# Patient Record
Sex: Female | Born: 1975 | Race: White | Hispanic: Yes | Marital: Single | Smoking: Never smoker
Health system: Southern US, Community
[De-identification: ages and names within clinical notes are randomized; demographics above are authoritative.]

---

## 1998-03-10 ENCOUNTER — Other Ambulatory Visit: Admission: RE | Admit: 1998-03-10 | Discharge: 1998-03-10 | Payer: Self-pay | Admitting: Obstetrics and Gynecology

## 1998-04-03 ENCOUNTER — Inpatient Hospital Stay (HOSPITAL_COMMUNITY): Admission: AD | Admit: 1998-04-03 | Discharge: 1998-04-03 | Payer: Self-pay | Admitting: Obstetrics

## 1998-10-05 ENCOUNTER — Inpatient Hospital Stay (HOSPITAL_COMMUNITY): Admission: AD | Admit: 1998-10-05 | Discharge: 1998-10-05 | Payer: Self-pay | Admitting: Obstetrics & Gynecology

## 1998-10-05 ENCOUNTER — Inpatient Hospital Stay (HOSPITAL_COMMUNITY): Admission: AD | Admit: 1998-10-05 | Discharge: 1998-10-08 | Payer: Self-pay | Admitting: *Deleted

## 1998-10-09 ENCOUNTER — Encounter (HOSPITAL_COMMUNITY): Admission: RE | Admit: 1998-10-09 | Discharge: 1999-01-07 | Payer: Self-pay | Admitting: Obstetrics & Gynecology

## 2000-04-24 ENCOUNTER — Encounter: Payer: Self-pay | Admitting: Obstetrics & Gynecology

## 2000-04-24 ENCOUNTER — Ambulatory Visit (HOSPITAL_COMMUNITY): Admission: RE | Admit: 2000-04-24 | Discharge: 2000-04-24 | Payer: Self-pay | Admitting: Obstetrics & Gynecology

## 2000-06-02 ENCOUNTER — Encounter: Payer: Self-pay | Admitting: *Deleted

## 2000-06-02 ENCOUNTER — Ambulatory Visit (HOSPITAL_COMMUNITY): Admission: RE | Admit: 2000-06-02 | Discharge: 2000-06-02 | Payer: Self-pay | Admitting: *Deleted

## 2000-09-29 ENCOUNTER — Inpatient Hospital Stay (HOSPITAL_COMMUNITY): Admission: AD | Admit: 2000-09-29 | Discharge: 2000-10-01 | Payer: Self-pay | Admitting: Obstetrics & Gynecology

## 2004-08-26 ENCOUNTER — Inpatient Hospital Stay (HOSPITAL_COMMUNITY): Admission: AD | Admit: 2004-08-26 | Discharge: 2004-08-28 | Payer: Self-pay | Admitting: Obstetrics

## 2006-06-28 ENCOUNTER — Encounter: Admission: RE | Admit: 2006-06-28 | Discharge: 2006-06-28 | Payer: Self-pay | Admitting: Family Medicine

## 2011-04-28 ENCOUNTER — Other Ambulatory Visit (HOSPITAL_COMMUNITY)
Admission: RE | Admit: 2011-04-28 | Discharge: 2011-04-28 | Disposition: A | Discharge status: Home / Self Care | Payer: Self-pay | Source: Ambulatory Visit | Attending: Gynecology | Admitting: Gynecology

## 2011-04-28 ENCOUNTER — Ambulatory Visit (INDEPENDENT_AMBULATORY_CARE_PROVIDER_SITE_OTHER): Payer: Self-pay | Admitting: Gynecology

## 2011-04-28 ENCOUNTER — Ambulatory Visit: Payer: Self-pay | Admitting: Gynecology

## 2011-04-28 ENCOUNTER — Encounter: Payer: Self-pay | Admitting: Gynecology

## 2011-04-28 DIAGNOSIS — Z113 Encounter for screening for infections with a predominantly sexual mode of transmission: Secondary | ICD-10-CM

## 2011-04-28 DIAGNOSIS — N898 Other specified noninflammatory disorders of vagina: Secondary | ICD-10-CM

## 2011-04-28 DIAGNOSIS — R300 Dysuria: Secondary | ICD-10-CM

## 2011-04-28 DIAGNOSIS — Z01419 Encounter for gynecological examination (general) (routine) without abnormal findings: Secondary | ICD-10-CM | POA: Insufficient documentation

## 2011-04-28 LAB — CBC WITH DIFFERENTIAL/PLATELET
Basophils Absolute: 0.1 10*3/uL (ref 0.0–0.1)
Basophils Relative: 2 % — ABNORMAL HIGH (ref 0–1)
Eosinophils Absolute: 0.1 10*3/uL (ref 0.0–0.7)
Eosinophils Relative: 2 % (ref 0–5)
HCT: 37.8 % (ref 36.0–46.0)
MCHC: 32.8 g/dL (ref 30.0–36.0)
MCV: 87.9 fL (ref 78.0–100.0)
Monocytes Absolute: 0.5 10*3/uL (ref 0.1–1.0)
Platelets: 239 10*3/uL (ref 150–400)
RDW: 13.8 % (ref 11.5–15.5)
WBC: 4.5 10*3/uL (ref 4.0–10.5)

## 2011-04-28 LAB — URINALYSIS W MICROSCOPIC + REFLEX CULTURE
Bilirubin Urine: NEGATIVE
Casts: NONE SEEN
Crystals: NONE SEEN
Glucose, UA: NEGATIVE mg/dL
Ketones, ur: NEGATIVE mg/dL
Leukocytes, UA: NEGATIVE
Nitrite: NEGATIVE
Protein, ur: NEGATIVE mg/dL
Specific Gravity, Urine: 1.015 (ref 1.005–1.030)
Urobilinogen, UA: 0.2 mg/dL (ref 0.0–1.0)
WBC, UA: NONE SEEN WBC/hpf (ref <3)
pH: 5.5 (ref 5.0–8.0)

## 2011-04-28 LAB — WET PREP FOR TRICH, YEAST, CLUE
Clue Cells Wet Prep HPF POC: NONE SEEN
Trich, Wet Prep: NONE SEEN
Yeast Wet Prep HPF POC: NONE SEEN

## 2011-04-28 MED ORDER — CLINDAMYCIN PHOSPHATE 2 % VA CREA: 1.0000 | TOPICAL_CREAM | Freq: Every day | VAGINAL | Status: AC

## 2011-04-28 NOTE — Progress Notes (Signed)
History:    36 y.o.  for annual exam who is a gravida 3 para 3 and had her annual exam done at the health department over 2-1/2 years ago and states it was normal. Patient complaining of one-month history of dysuria frequency and now most recently vulvar pruritus and a yellow discharge. Patient frequently does her self breast examination. Had a ParaGard T380A IUD placed in the health department and she states is due to be removed in 2016. She states her menstrual cycles are regular.  Past medical history,surgical history, family history and social history were all reviewed and documented in the EPIC chart.  Gynecologic History Patient's last menstrual period was 04/16/2011. Contraception: IUD Last Pap: 2010. Results were: normal Last mammogram: Not indicated. Results were: Not indicated  Obstetric History OB History    Grav Para Term Preterm Abortions TAB SAB Ect Mult Living   3 3 3       3      # Outc Date GA Lbr Len/2nd Wgt Sex Del Anes PTL Lv   1 TRM     F SVD  No Yes   2 TRM     M SVD  No Yes   3 TRM     F SVD  No Yes       ROS:  Was performed and pertinent positives and negatives are included in the history.  Exam: chaperone present  BP 118/78  Ht 5' 0.75" (1.543 m)  Wt 142 lb (64.411 kg)  BMI 27.05 kg/m2  LMP 04/16/2011  Body mass index is 27.05 kg/(m^2).  General appearance : Well developed well nourished female. No acute distress HEENT: Neck supple, trachea midline, no carotid bruits, no thyroidmegaly Lungs: Clear to auscultation, no rhonchi or wheezes, or rib retractions  Heart: Regular rate and rhythm, no murmurs or gallops Breast:Examined in sitting and supine position were symmetrical in appearance, no palpable masses or tenderness,  no skin retraction, no nipple inversion, no nipple discharge, no skin discoloration, no axillary or supraclavicular lymphadenopathy Abdomen: no palpable masses or tenderness, no rebound or guarding Extremities: no edema or skin  discoloration or tenderness  Pelvic:  Bartholin, Urethra, Skene Glands: Within normal limits             Vagina: No gross lesions or discharge  Cervix: No gross lesions or discharge, IUD string seen  Uterus  retroverted, normal size, shape and consistency, non-tender and mobile  Adnexa  Without masses or tenderness  Anus and perineum  normal   Rectovaginal  normal sphincter tone without palpated masses or tenderness             Hemoccult not done   Wet prep results: Few bacteria  Urinalysis result: Nonspecific was sent for culture  Assessment/Plan:  36 y.o. female for annual exam will treat patient for suspected BV with Cleocin vaginal cream to apply each bedtime for 5 days. New Pap smear guidelines discussed. Last Pap smear returned a half years ago so Pap smear was done today. She is instructed to her monthly self breast examination.CBC, cholesterol, and urinalysis was obtained today. CBC, cholesterol, and urinalysis and urine culture were obtained today. GC and Chlamydia culture obtained as well resulting in time of this dictation. Will notify patient is any abnormality of any the above mentioned test otherwise we'll see her back in one year or when necessary. She scheduled to have her IUD removed in 2016.

## 2011-04-28 NOTE — Patient Instructions (Signed)
Auto examen de mama (Breast Self-Exam) El auto examen puede ayudarla a encontrar modificaciones o trastornos en la mama cuando todava son pequeos. Haga el auto examen de la mama:  Todos los meses.   Una semana despus de su perodo (ciclo menstrual o periodo menstrual).   El primer da de cada mes, si ya no tiene el perodo.  Debe estar atenta a:  Cambios en el color, tamao o forma de la mama.   Hoyuelos en los senos.   Modificaciones en los pezones o la piel.   Sequedad en la piel de los senos o los pezones.   Secreciones acuosas o sanguinolentas en los pezones.  Trate de sentir la presencia de:  Bultos.   Durezas.   Cualquier otro cambio.  CUIDADOS EN EL HOGAR  Hay 3 formas de hacer el examen autoexamen de mama: Prese frente a un espejo.  Levante los brazos por arriba de la cabeza y gire de un lado al otro.   Coloque las manos en las caderas e inclnese hacia abajo y luego gire de un lado al otro.   Inclnese hacia delante y gire de un lado al otro.  En la ducha.  Con las manos enjabonadas, revise los dos pechos. Luego controle por arriba y por debajo de la clavcula y las axilas.   Pase los dedos por la zona superior e inferior de la clavcula hasta debajo del seno, y desde el centro del pecho hasta el borde exterior de la axila. Controle si hay bultos o zonas duras.   Utilizando las yemas de tres dedos del medio revise todo su seno presionando la mano sobre el pecho, haciendo crculos o movimientos hacia arriba y hacia abajo.  Acostada.  Acustese sobre su cama.   Coloque una almohada pequea debajo de la mama que va a controlar. En esa misma posicin, ponga la mano detrs de la cabeza.   Con la otra mano, use los 3 dedos del medio para palpar el pecho.   Mueva los dedos en un crculo alrededor de la mama. Presione firmemente sobre todas las partes de la mama para detectar bultos.  SOLICITE AYUDA DE INMEDIATO SI: Encuentra algn cambio para que puedan  realizarle un estudio. Document Released: 03/12/2010 Document Revised: 01/27/2011 ExitCare Patient Information 2012 [employer]. 

## 2011-04-29 LAB — GC/CHLAMYDIA PROBE AMP, GENITAL
Chlamydia, DNA Probe: NEGATIVE
GC Probe Amp, Genital: NEGATIVE

## 2013-12-23 ENCOUNTER — Encounter: Payer: Self-pay | Admitting: Gynecology

## 2014-10-13 ENCOUNTER — Other Ambulatory Visit: Payer: Self-pay | Admitting: Nurse Practitioner

## 2014-10-13 DIAGNOSIS — T193XXA Foreign body in uterus, initial encounter: Secondary | ICD-10-CM

## 2014-10-15 ENCOUNTER — Ambulatory Visit
Admission: RE | Admit: 2014-10-15 | Discharge: 2014-10-15 | Disposition: A | Discharge status: Home / Self Care | Payer: Self-pay | Source: Ambulatory Visit | Attending: Nurse Practitioner | Admitting: Nurse Practitioner

## 2014-10-15 DIAGNOSIS — T193XXA Foreign body in uterus, initial encounter: Secondary | ICD-10-CM

## 2016-02-19 ENCOUNTER — Ambulatory Visit (INDEPENDENT_AMBULATORY_CARE_PROVIDER_SITE_OTHER): Payer: Self-pay | Admitting: Physician Assistant

## 2016-02-19 VITALS — BP 110/78 | HR 65 | Temp 98.4°F | Resp 16 | Ht 62.0 in | Wt 159.0 lb

## 2016-02-19 DIAGNOSIS — J069 Acute upper respiratory infection, unspecified: Secondary | ICD-10-CM

## 2016-02-19 DIAGNOSIS — B9789 Other viral agents as the cause of diseases classified elsewhere: Secondary | ICD-10-CM

## 2016-02-19 MED ORDER — HYDROCOD POLST-CPM POLST ER 10-8 MG/5ML PO SUER: 5.0000 mL | Freq: Two times a day (BID) | ORAL | 0 refills | Status: DC | PRN

## 2016-02-19 MED ORDER — AZELASTINE HCL 0.15 % NA SOLN: 2.0000 | Freq: Two times a day (BID) | NASAL | 0 refills | Status: DC

## 2016-02-19 MED ORDER — GUAIFENESIN ER 1200 MG PO TB12: 1.0000 | ORAL_TABLET | Freq: Two times a day (BID) | ORAL | 1 refills | Status: DC | PRN

## 2016-02-19 NOTE — Progress Notes (Signed)
Patient ID: Angela Brady, female     DOB: 1975-03-09, 40 y.o.    MRN: [id number]  PCP: No primary care provider on file.  Chief Complaint  Patient presents with  . Cough    With chest pain  . Nasal Congestion  . Watery eyes  . Flu Vaccine    Subjective:   This patient is new to this practice and presents for evaluation of cough and nasal congestion. She is accompanied by her two teenage daughters, the elder of whom translates.  3 days ago she developed initial symptoms, progressively worsening. Advil, NyQuil and Dayquil without benefit. Coughing causes CP and keeps her awake at night. "lungs" hurt. Eyes are itchy and watery. Mild subjective fever/chills. Mild nausea. Mildly tremulous. Has muscle pain in her back and arms. No vomiting or diarrhea.  Has not received flu vaccine this season.   Review of Systems As above.  Prior to Admission medications   Not on File     No Known Allergies   There are no active problems to display for this patient.    History reviewed. No pertinent family history.   Social History   Social History  . Marital status: Single    Spouse name: N/A  . Number of children: 3  . Years of education: 6th grade   Occupational History  .  Not Employed   Social History Main Topics  . Smoking status: Never Smoker  . Smokeless tobacco: Never Used  . Alcohol use No  . Drug use: No  . Sexual activity: Yes    Birth control/ protection: IUD     Comment: PARAGARD   Other Topics Concern  . Not on file   Social History Narrative   [place]   Came to the [place] in [place].   Lives with her husband and 3 children.         Objective:  Physical Exam  Constitutional: She is oriented to person, place, and time. She appears well-developed and well-nourished. She is active and cooperative. No distress.  BP 110/78   Pulse 65   Temp 98.4 F (36.9 C) (Oral)   Resp 16   Ht 5\' 2"  (1.575 m)   Wt 159  lb (72.1 kg)   LMP 01/21/2016   SpO2 99%   BMI 29.08 kg/m   HENT:  Head: Normocephalic and atraumatic.  Right Ear: Hearing and external ear normal.  Left Ear: Hearing and external ear normal.  Nose: Mucosal edema present. No rhinorrhea. Right sinus exhibits maxillary sinus tenderness. Right sinus exhibits no frontal sinus tenderness. Left sinus exhibits maxillary sinus tenderness. Left sinus exhibits no frontal sinus tenderness.  Mouth/Throat: Uvula is midline, oropharynx is clear and moist and mucous membranes are normal. No oropharyngeal exudate.  Eyes: Conjunctivae are normal. No scleral icterus.  Neck: Normal range of motion. Neck supple. No thyromegaly present.  Cardiovascular: Normal rate, regular rhythm and normal heart sounds.   Pulses:      Radial pulses are 2+ on the right side, and 2+ on the left side.  Pulmonary/Chest: Effort normal and breath sounds normal. She has no decreased breath sounds. She has no wheezes. She has no rhonchi. She has no rales.  Lymphadenopathy:       Head (right side): No tonsillar, no preauricular, no posterior auricular and no occipital adenopathy present.       Head (left side): No tonsillar, no preauricular, no posterior auricular and no occipital adenopathy present.  She has no cervical adenopathy.       Right: No supraclavicular adenopathy present.       Left: No supraclavicular adenopathy present.  Neurological: She is alert and oriented to person, place, and time. No sensory deficit.  Skin: Skin is warm, dry and intact. No rash noted. No cyanosis or erythema. Nails show no clubbing.  Psychiatric: She has a normal mood and affect. Her speech is normal and behavior is normal.             Assessment & Plan:  1. Viral URI with cough Supportive care.  Anticipatory guidance.  RTC if symptoms worsen/persist. - chlorpheniramine-HYDROcodone (TUSSIONEX PENNKINETIC ER) 10-8 MG/5ML SUER; Take 5 mLs by mouth every 12 (twelve) hours as needed for  cough.  Dispense: 100 mL; Refill: 0 - Guaifenesin (MUCINEX MAXIMUM STRENGTH) 1200 MG TB12; Take 1 tablet (1,200 mg total) by mouth every 12 (twelve) hours as needed.  Dispense: 14 tablet; Refill: 1 - Azelastine HCl 0.15 % SOLN; Place 2 sprays into both nostrils 2 (two) times daily.  Dispense: 30 mL; Refill: 0   Fernande Brashelle S. Taiwana Willison, PA-C Physician Assistant-Certified [facility]

## 2016-02-19 NOTE — Patient Instructions (Addendum)
   IF you received an x-ray today, you will receive an invoice from [facility]. Please contact [facility] at [phone] with questions or concerns regarding your invoice.   IF you received labwork today, you will receive an invoice from [facility]. Please contact [facility] at [phone] with questions or concerns regarding your invoice.   Our billing staff will not be able to assist you with questions regarding bills from these companies.  You will be contacted with the lab results as soon as they are available. The fastest way to get your results is to activate your My Chart account. Instructions are located on the last page of this paperwork. If you have not heard from us regarding the results in 2 weeks, please contact this office.      Infeccin del tracto respiratorio superior, adultos (Upper Respiratory Infection, Adult) La mayora de las infecciones del tracto respiratorio superior estn causadas por un virus. Un infeccin del tracto respiratorio superior afecta la nariz, la garganta y las vas respiratorias superiores. El tipo ms comn de infeccin del tracto respiratorio superior es el resfro comn. CUIDADOS EN EL HOGAR  Tome los medicamentos solamente como se lo haya indicado el mdico.  A fin de aliviar el dolor de garganta, haga grgaras con solucin salina templada o consuma caramelos para la tos, como se lo haya indicado el mdico.  Use un humidificador de vapor clido o inhale el vapor de la ducha para aumentar la humedad del aire. Esto facilitar la respiracin.  Beba suficiente lquido para mantener el pis (orina) claro o de color amarillo plido.  Tome sopas y caldos transparentes.  Siga una dieta saludable.  Descanse todo lo que sea necesario.  Regrese al trabajo cuando la fiebre haya desaparecido o el mdico le diga que puede hacerlo.  Es posible que deba quedarse en su casa durante un tiempo prolongado, para no transmitir la infeccin a  los dems.  Tambin puede usar un barbijo y lavarse las manos con frecuencia para evitar el contagio del virus.  Si tiene asma, use el inhalador con mayor frecuencia.  No consuma ningn producto que contenga tabaco, lo que incluye cigarrillos, tabaco de mascar o cigarrillos electrnicos. Si necesita ayuda para dejar de fumar, consulte al mdico. SOLICITE AYUDA SI:  Siente que empeora o que no mejora.  Los medicamentos no logran aliviar los sntomas.  Tiene escalofros.  La dificultad para respirar es peor.  Tiene mucosidad marrn o roja.  Tiene una secrecin amarilla o marrn de la nariz.  Le duele la cara, especialmente al inclinarse hacia adelante.  Tiene fiebre.  Tiene los ganglios del cuello hinchados.  Siente dolor al tragar.  Tiene zonas blancas en la parte de atrs de la garganta. SOLICITE AYUDA DE INMEDIATO SI:  Los siguientes sntomas son muy intensos o constantes:  Dolor de cabeza.  Dolor de odos.  Dolor en la frente, detrs de los ojos y por encima de los pmulos (dolor sinusal).  Dolor en el pecho.  Tiene enfermedad pulmonar prolongada (crnica) y cualquiera de estos sntomas:  Sibilancias.  Tos prolongada.  Tos con sangre.  Cambio en la mucosidad habitual.  Presenta rigidez en el cuello.  Tiene cambios en:  La visin.  La audicin.  El pensamiento.  El estado de nimo. ASEGRESE DE QUE:  Comprende estas instrucciones.  Controlar su afeccin.  Recibir ayuda de inmediato si no mejora o si empeora. Esta informacin no tiene como fin reemplazar el consejo del mdico. Asegrese de hacerle al mdico   cualquier pregunta que tenga. Document Released: 07/12/2010 Document Revised: 06/24/2014 Document Reviewed: 05/15/2013 Elsevier Interactive Patient Education  2017 Elsevier Inc.  

## 2016-11-17 ENCOUNTER — Other Ambulatory Visit: Payer: Self-pay | Admitting: Obstetrics & Gynecology

## 2016-11-17 DIAGNOSIS — Z1231 Encounter for screening mammogram for malignant neoplasm of breast: Secondary | ICD-10-CM

## 2017-01-06 ENCOUNTER — Encounter: Payer: Self-pay | Admitting: Urgent Care

## 2017-01-06 ENCOUNTER — Ambulatory Visit: Payer: Self-pay | Admitting: Urgent Care

## 2017-01-06 VITALS — BP 122/82 | HR 67 | Temp 98.5°F | Resp 16 | Ht 62.0 in | Wt 153.2 lb

## 2017-01-06 DIAGNOSIS — J069 Acute upper respiratory infection, unspecified: Secondary | ICD-10-CM

## 2017-01-06 DIAGNOSIS — B9789 Other viral agents as the cause of diseases classified elsewhere: Secondary | ICD-10-CM

## 2017-01-06 DIAGNOSIS — J029 Acute pharyngitis, unspecified: Secondary | ICD-10-CM

## 2017-01-06 LAB — POCT RAPID STREP A (OFFICE): RAPID STREP A SCREEN: NEGATIVE

## 2017-01-06 MED ORDER — BENZONATATE 100 MG PO CAPS: 100.0000 mg | ORAL_CAPSULE | Freq: Three times a day (TID) | ORAL | 0 refills | Status: DC | PRN

## 2017-01-06 MED ORDER — HYDROCODONE-HOMATROPINE 5-1.5 MG/5ML PO SYRP: 5.0000 mL | ORAL_SOLUTION | Freq: Every evening | ORAL | 0 refills | Status: DC | PRN

## 2017-01-06 MED ORDER — PSEUDOEPHEDRINE HCL ER 120 MG PO TB12: 120.0000 mg | ORAL_TABLET | Freq: Two times a day (BID) | ORAL | 3 refills | Status: DC

## 2017-01-06 NOTE — Progress Notes (Signed)
  MRN: [id number] DOB: 1975-11-20  Subjective:   Angela Brady is a 41 y.o. female presenting for 2 day history of subjective fever, sore throat, productive cough, nausea without vomiting from her post-nasal drainage. She's also had mild chest discomfort with her cough. Denies sinus pain, ear pain, ear drainage, shob, wheezing, abdominal pain, rashes. Denies smoking cigarettes. Denies history of allergies, asthma. She is worried she might have strep throat since her husband tested positive for it a week ago.   Angela Brady is not currently taking any medications and has No Known Allergies.  Angela Brady denies past medical and surgical history.   Objective:   Vitals: BP 122/82   Pulse 67   Temp 98.5 F (36.9 C) (Oral)   Resp 16   Ht 5\' 2"  (1.575 m)   Wt 153 lb 3.2 oz (69.5 kg)   SpO2 97%   BMI 28.02 kg/m   Physical Exam  Constitutional: She is oriented to person, place, and time. She appears well-developed and well-nourished.  HENT:  TM's intact bilaterally, no effusions or erythema. Nasal turbinates pink and moist, nasal passages patent. Bilateral maxillary sinus tenderness with very superficial palpation. Oropharynx with thick streaks of post-nasal drainage, mucous membranes moist.  Eyes: Right eye exhibits no discharge. Left eye exhibits no discharge.  Neck: Normal range of motion. Neck supple.  Cardiovascular: Normal rate, regular rhythm and intact distal pulses. Exam reveals no gallop and no friction rub.  No murmur heard. Pulmonary/Chest: No respiratory distress. She has no wheezes. She has no rales.  Lymphadenopathy:    She has no cervical adenopathy.  Neurological: She is alert and oriented to person, place, and time.  Skin: Skin is warm and dry.  Psychiatric: She has a normal mood and affect.   Results for orders placed or performed in visit on 01/06/17 (from the past 24 hour(s))  POCT rapid strep A     Status: None   Collection Time: 01/06/17  4:17 PM  Result Value Ref  Range   Rapid Strep A Screen Negative Negative   Assessment and Plan :   1. Viral URI with cough 2. Sore throat - Likely viral in nature, advised supportive care. - If no improvement or symptoms do not resolve return to clinic in 1 week   Wallis BambergMario Peni Rupard, [place] JerseyPA-C [facility] at [facility] [phone] 01/06/2017  3:58 PM

## 2017-01-06 NOTE — Patient Instructions (Addendum)
Eaton CorporationFiebre en los adultos (Fever, Adult) La fiebre es el aumento de la Arts development officertemperatura corporal. A menudo se la define como una temperatura de 100F (38C) o ms. Por lo general, los episodios de fiebre leve o moderada que duran poco tiempo no tienen efectos a largo plazo y no requieren TEFL teachertratamiento. Los episodios de fiebre moderada o alta pueden ser U.S. Bancorpmolestos y, a veces, un signo de una enfermedad grave. La sudoracin que se puede presentar cuando los episodios de fiebre son reiterados o prolongados tambin pueden derivar en deshidratacin. Para confirmar la presencia de fiebre, se toma la temperatura con un termmetro. La medicin de la temperatura puede variar en funcin de los siguientes factores:  La edad.  La hora del da.  El lugar donde se coloca el termmetro: ? La boca (oral). ? El recto (rectal). ? El odo (timpnico). ? Debajo del brazo Administrator, Civil Service(axilar). ? La frente (temporal). INSTRUCCIONES PARA EL CUIDADO EN EL HOGAR Est atento a cualquier cambio en los sntomas. Tome estas medidas para controlar la afeccin:  Tome los medicamentos de venta libre y los recetados solamente como se lo haya indicado el mdico. Siga cuidadosamente las indicaciones en cuanto a la dosis.  Si le recetaron un antibitico, tmelo como se lo haya indicado el mdico. No deje de tomar los antibiticos aunque comience a Actorsentirse mejor.  Descanse todo lo que sea necesario.  Beba suficiente lquido para Photographermantener la orina clara o de color amarillo plido. Esto ayuda a Statisticianevitar la deshidratacin.  Higiencese con Delma Freezeuna esponja o dese un bao con agua a temperatura ambiente para ayudar a Teaching laboratory technicianbajar la temperatura corporal, si es necesario. No use agua helada.  No se tape con demasiadas mantas ni use ropa abrigada. SOLICITE ATENCIN MDICA SI:  Vomita.  No puede comer ni beber sin vomitar.  Tiene diarrea.  Siente dolor al ConocoPhillipsorinar.  Los sntomas no mejoran con Scientist, research (medical)el tratamiento.  Presenta nuevos sntomas.  Est muy  dbil.  SOLICITE ATENCIN MDICA DE INMEDIATO SI:  Le falta el aire o tiene dificultad para respirar.  Est mareado o se desmaya.  Est desorientado o confundido.  Tiene signos de deshidratacin, como sequedad en la boca, disminucin de la cantidad de [place]orina o palidez.  Siente dolor intenso en el abdomen.  Tiene nuseas o diarrea persistentes.  Tiene una erupcin cutnea.  Los sntomas empeoran repentinamente.  Esta informacin no tiene Theme park managercomo fin reemplazar el consejo del mdico. Asegrese de hacerle al mdico cualquier pregunta que tenga. Document Released: 11/17/2004 Document Revised: 10/29/2014 Document Reviewed: 04/03/2014 Elsevier Interactive Patient Education  2017 Elsevier Inc.    Tos en los adultos (Cough, Adult) La tos es un reflejo que limpia la garganta y las vas respiratorias, y ayuda a la curacin y Training and development officerla proteccin de los pulmones. Es normal toser de Teacher, English as a foreign languagevez en cuando, pero cuando esta se presenta con otros sntomas o dura mucho tiempo puede ser el signo de una enfermedad que Irwinnecesita tratamiento. La tos puede durar solo 2 o 3semanas (aguda) o ms de 8semanas (crnica). CAUSAS Comnmente, las causas de la tos son las siguientes:  Visual merchandisernhalar sustancias que Sealed Air Corporationirritan los pulmones.  Una infeccin respiratoria viral o bacteriana.  Alergias.  Asma.  Goteo posnasal.  Fumar.  El retroceso de cido estomacal hacia el esfago (reflujo gastroesofgico).  Algunos medicamentos.  Los problemas pulmonares crnicos, entre ellos, la enfermedad pulmonar obstructiva crnica (EPOC) (o, en contadas ocasiones, el cncer de pulmn).  Otras afecciones, como la insuficiencia cardaca. INSTRUCCIONES PARA EL CUIDADO EN EL HOGAR Est atento  a cualquier cambio en los sntomas. Tome estas medidas para Paramedicaliviar las molestias:  Tome los medicamentos solamente como se lo haya indicado el mdico. ? Si le recetaron un antibitico, tmelo como se lo haya indicado el mdico. No deje de tomar  los antibiticos aunque comience a Actorsentirse mejor. ? Hable con el mdico antes de tomar un antitusivo.  Beba suficiente lquido para Photographermantener la orina clara o de color amarillo plido.  Si el aire est seco, use un vaporizador o un humidificador con vapor fro en su habitacin o en su casa para ayudar a aflojar las secreciones.  Evite todas las cosas que le producen tos en el trabajo o en su casa.  Si la tos aumenta durante la noche, intente dormir semisentado.  Evite el humo del cigarrillo. Si fuma, deje de hacerlo. Si necesita ayuda para dejar de fumar, consulte al mdico.  Evite la cafena.  Evite el alcohol.  Descanse todo lo que sea necesario. SOLICITE ATENCIN MDICA SI:  Aparecen nuevos sntomas.  Expectora pus al toser.  La tos no mejora despus de 2 o 3semanas, o empeora.  No puede controlar la tos con antitusivos y no puede dormir bien.  Tiene un dolor que se intensifica o que no puede Sales promotion account executivecontrolar con analgsicos.  Tiene fiebre.  Baja de peso sin causa aparente.  Tiene transpiracin nocturna.  SOLICITE ATENCIN MDICA DE INMEDIATO SI:  Tose y escupe sangre.  Tiene dificultad para respirar.  Los latidos cardacos son muy rpidos.  Esta informacin no tiene Theme park managercomo fin reemplazar el consejo del mdico. Asegrese de hacerle al mdico cualquier pregunta que tenga. Document Released: 09/15/2010 Document Revised: 10/29/2014 Document Reviewed: 04/16/2014 Elsevier Interactive Patient Education  2017 [employer]vier Inc.     IF you received an x-ray today, you will receive an invoice from [facility]. Please contact [facility] at [phone] with questions or concerns regarding your invoice.   IF you received labwork today, you will receive an invoice from [facility]. Please contact [facility] at [phone] with questions or concerns regarding your invoice.   Our billing staff will not be able to assist you with questions regarding bills from these  companies.  You will be contacted with the lab results as soon as they are available. The fastest way to get your results is to activate your My Chart account. Instructions are located on the last page of this paperwork. If you have not heard from us regarding the results in 2 weeks, please contact this office.

## 2017-01-09 LAB — CULTURE, GROUP A STREP: STREP A CULTURE: NEGATIVE

## 2017-01-31 ENCOUNTER — Encounter: Payer: Self-pay | Admitting: Urgent Care

## 2017-01-31 ENCOUNTER — Ambulatory Visit: Payer: Self-pay | Admitting: Urgent Care

## 2017-01-31 VITALS — BP 109/76 | HR 64 | Temp 98.5°F | Ht 62.0 in | Wt 154.2 lb

## 2017-01-31 DIAGNOSIS — R0982 Postnasal drip: Secondary | ICD-10-CM

## 2017-01-31 DIAGNOSIS — R05 Cough: Secondary | ICD-10-CM

## 2017-01-31 DIAGNOSIS — J029 Acute pharyngitis, unspecified: Secondary | ICD-10-CM

## 2017-01-31 DIAGNOSIS — R059 Cough, unspecified: Secondary | ICD-10-CM

## 2017-01-31 MED ORDER — PREDNISONE 20 MG PO TABS: ORAL_TABLET | ORAL | 0 refills | Status: DC

## 2017-01-31 MED ORDER — HYDROCODONE-HOMATROPINE 5-1.5 MG/5ML PO SYRP: 5.0000 mL | ORAL_SOLUTION | Freq: Every evening | ORAL | 0 refills | Status: DC | PRN

## 2017-01-31 MED ORDER — CETIRIZINE HCL 10 MG PO TABS: 10.0000 mg | ORAL_TABLET | Freq: Every day | ORAL | 11 refills | Status: DC

## 2017-01-31 MED ORDER — AZITHROMYCIN 250 MG PO TABS: ORAL_TABLET | ORAL | 0 refills | Status: DC

## 2017-01-31 NOTE — Progress Notes (Signed)
    MRN: [id number] DOB: 03-Nov-1975  Subjective:   Angela MalesMaria Y Brady is a 41 y.o. female presenting for follow up on viral uri with cough. Last OV was 01/06/2017, was managed supportively with cough suppression medications, Sudafed. She notes improvement in all her symptoms except her cough. Today, reports ongoing dry cough, hurts her belly and throat now. Denies sinus pain, ear pain, fever, chest pain, shob, wheezing, n/v, abdominal pain, rashes. Denies smoking cigarettes or drinking alcohol.   Angela Brady is not currently taking any medications and has No Known Allergies.  Angela Brady denies past medical and surgical history.   Objective:   Vitals: BP 109/76 (BP Location: Left Arm, Patient Position: Sitting, Cuff Size: Normal)   Pulse 64   Temp 98.5 F (36.9 C)   Ht 5\' 2"  (1.575 m)   Wt 154 lb 3.2 oz (69.9 kg)   SpO2 99%   BMI 28.20 kg/m   Physical Exam  Constitutional: She is oriented to person, place, and time. She appears well-developed and well-nourished.  HENT:  TM's intact bilaterally, no effusions or erythema. Nasal turbinates pink and moist, nasal passages patent. No sinus tenderness. Oropharynx clear, mucous membranes moist.  Eyes: Right eye exhibits no discharge. Left eye exhibits no discharge.  Neck: Normal range of motion. Neck supple.  Cardiovascular: Normal rate, regular rhythm and intact distal pulses. Exam reveals no gallop and no friction rub.  No murmur heard. Pulmonary/Chest: No respiratory distress. She has no wheezes. She has no rales.  Lymphadenopathy:    She has no cervical adenopathy.  Neurological: She is alert and oriented to person, place, and time.  Skin: Skin is warm and dry.   Assessment and Plan :   1. Cough 2. Sore throat 3. Post-nasal drainage - Physical exam findings reassuring. Will start short steroid course. If no improvement with this, recommended patient start azithromycin to address infectious etiology. Patient has low risk factors, will  consider x-ray if no resolution of symptoms. Return-to-clinic precautions discussed, patient verbalized understanding.   Wallis BambergMario Cristie Mckinney, PA-C [facility] [phone] 01/31/2017 2:01 PM

## 2017-01-31 NOTE — Patient Instructions (Signed)
Tos en los adultos (Cough, Adult) La tos es un reflejo que limpia la garganta y las vas respiratorias, y ayuda a la curacin y Training and development officerla proteccin de los pulmones. Es normal toser de Teacher, English as a foreign languagevez en cuando, pero cuando esta se presenta con otros sntomas o dura mucho tiempo puede ser el signo de una enfermedad que River Parknecesita tratamiento. La tos puede durar solo 2 o 3semanas (aguda) o ms de 8semanas (crnica). CAUSAS Comnmente, las causas de la tos son las siguientes:  Visual merchandisernhalar sustancias que Sealed Air Corporationirritan los pulmones.  Una infeccin respiratoria viral o bacteriana.  Alergias.  Asma.  Goteo posnasal.  Fumar.  El retroceso de cido estomacal hacia el esfago (reflujo gastroesofgico).  Algunos medicamentos.  Los problemas pulmonares crnicos, entre ellos, la enfermedad pulmonar obstructiva crnica (EPOC) (o, en contadas ocasiones, el cncer de pulmn).  Otras afecciones, como la insuficiencia cardaca. INSTRUCCIONES PARA EL CUIDADO EN EL HOGAR Est atento a cualquier cambio en los sntomas. Tome estas medidas para Paramedicaliviar las molestias:  Tome los medicamentos solamente como se lo haya indicado el mdico. ? Si le recetaron un antibitico, tmelo como se lo haya indicado el mdico. No deje de tomar los antibiticos aunque comience a Actorsentirse mejor. ? Hable con el mdico antes de tomar un antitusivo.  Beba suficiente lquido para Photographermantener la orina clara o de color amarillo plido.  Si el aire est seco, use un vaporizador o un humidificador con vapor fro en su habitacin o en su casa para ayudar a aflojar las secreciones.  Evite todas las cosas que le producen tos en el trabajo o en su casa.  Si la tos aumenta durante la noche, intente dormir semisentado.  Evite el humo del cigarrillo. Si fuma, deje de hacerlo. Si necesita ayuda para dejar de fumar, consulte al mdico.  Evite la cafena.  Evite el alcohol.  Descanse todo lo que sea necesario. SOLICITE ATENCIN MDICA SI:  Aparecen nuevos  sntomas.  Expectora pus al toser.  La tos no mejora despus de 2 o 3semanas, o empeora.  No puede controlar la tos con antitusivos y no puede dormir bien.  Tiene un dolor que se intensifica o que no puede Sales promotion account executivecontrolar con analgsicos.  Tiene fiebre.  Baja de peso sin causa aparente.  Tiene transpiracin nocturna.  SOLICITE ATENCIN MDICA DE INMEDIATO SI:  Tose y escupe sangre.  Tiene dificultad para respirar.  Los latidos cardacos son muy rpidos.  Esta informacin no tiene Theme park managercomo fin reemplazar el consejo del mdico. Asegrese de hacerle al mdico cualquier pregunta que tenga. Document Released: 09/15/2010 Document Revised: 10/29/2014 Document Reviewed: 04/16/2014 Elsevier Interactive Patient Education  2017 [employer]

## 2017-12-15 ENCOUNTER — Ambulatory Visit (HOSPITAL_COMMUNITY)
Admission: EM | Admit: 2017-12-15 | Discharge: 2017-12-15 | Disposition: A | Discharge status: Home / Self Care | Payer: Self-pay | Attending: Family Medicine | Admitting: Family Medicine

## 2017-12-15 ENCOUNTER — Other Ambulatory Visit: Payer: Self-pay

## 2017-12-15 DIAGNOSIS — J302 Other seasonal allergic rhinitis: Secondary | ICD-10-CM | POA: Insufficient documentation

## 2017-12-15 LAB — POCT RAPID STREP A: Streptococcus, Group A Screen (Direct): NEGATIVE

## 2017-12-15 MED ORDER — CETIRIZINE HCL 10 MG PO TABS: 10.0000 mg | ORAL_TABLET | Freq: Every day | ORAL | 1 refills | Status: AC

## 2017-12-15 MED ORDER — FLUTICASONE PROPIONATE 50 MCG/ACT NA SUSP: 1.0000 | Freq: Every day | NASAL | 2 refills | Status: AC

## 2017-12-15 NOTE — Discharge Instructions (Addendum)
I believe this is allergy related We will try Flonase and zyrtec for symptoms.  Your strep test was negative.

## 2017-12-15 NOTE — ED Provider Notes (Signed)
[facility]    CSN: [id number] Arrival date & time: 12/15/17  1303     History   Chief Complaint Chief Complaint  Patient presents with  . Sore Throat    HPI Angela Brady is a 42 y.o. female.   Patient is a 43 year old female presents with sore throat, drainage, nasal congestion, mild cough x4 days.  Symptoms have been constant and remain the same.  She has not taken anything for her symptoms.  She denies any fever, chills, aches, fatigue, night sweats.  ROS per HPI   The history is provided by the patient.  Sore Throat  This is a new problem. The current episode started more than 2 days ago. The problem occurs constantly.    No past medical history on file.  There are no active problems to display for this patient.   No past surgical history on file.  OB History    Gravida  3   Para  3   Term  3   Preterm      AB      Living  3     SAB      TAB      Ectopic      Multiple      Live Births  3            Home Medications    Prior to Admission medications   Medication Sig Start Date End Date Taking? Authorizing Provider  cetirizine (ZYRTEC) 10 MG tablet Take 1 tablet (10 mg total) by mouth daily. 12/15/17   Dazani Norby, Gloris Manchester A, NP  fluticasone (FLONASE) 50 MCG/ACT nasal spray Place 1 spray into both nostrils daily. 12/15/17   Janace Aris, NP    Family History No family history on file.  Social History Social History   Tobacco Use  . Smoking status: Never Smoker  . Smokeless tobacco: Never Used  Substance Use Topics  . Alcohol use: No  . Drug use: No     Allergies   Patient has no known allergies.   Review of Systems Review of Systems   Physical Exam Triage Vital Signs ED Triage Vitals  Enc Vitals Group     BP --      Pulse Rate 12/15/17 1344 75     Resp 12/15/17 1344 18     Temp 12/15/17 1344 98.6 F (37 C)     Temp Source 12/15/17 1344 Oral     SpO2 12/15/17 1344 100 %     Weight 12/15/17 1344  145 lb (65.8 kg)     Height --      Head Circumference --      Peak Flow --      Pain Score 12/15/17 1343 8     Pain Loc --      Pain Edu? --      Excl. in GC? --    No data found.  Updated Vital Signs Pulse 75   Temp 98.6 F (37 C) (Oral)   Resp 18   Wt 145 lb (65.8 kg)   SpO2 100%   BMI 26.52 kg/m   Visual Acuity Right Eye Distance:   Left Eye Distance:   Bilateral Distance:    Right Eye Near:   Left Eye Near:    Bilateral Near:     Physical Exam  Constitutional: She appears well-developed and well-nourished.  Very pleasant. Non toxic or ill appearing.   HENT:  Head: Normocephalic and atraumatic.  Right  Ear: Hearing and tympanic membrane normal.  Left Ear: Hearing and tympanic membrane normal.  Mouth/Throat: Mucous membranes are normal. Tonsils are 0 on the right. Tonsils are 0 on the left. No tonsillar exudate.  Bilateral TMs normal.  External ears normal.  Without posterior oropharyngeal erythema, tonsillar swelling or exudates. No lesions.  Moderate amount PND No lymphadenopathy.   Neck: Normal range of motion.  Cardiovascular: Normal rate, regular rhythm and normal heart sounds.  Pulmonary/Chest: Effort normal and breath sounds normal.  Lungs clear in all fields. No dyspnea or distress. No retractions or nasal flaring.   Neurological: She is alert.  Skin: Skin is warm and dry.  Psychiatric: She has a normal mood and affect.  Nursing note and vitals reviewed.    UC Treatments / Results  Labs (all labs ordered are listed, but only abnormal results are displayed) Labs Reviewed  CULTURE, GROUP A STREP [facility])  POCT RAPID STREP A    EKG None  Radiology No results found.  Procedures Procedures (including critical care time)  Medications Ordered in UC Medications - No data to display  Initial Impression / Assessment and Plan / UC Course  I have reviewed the triage vital signs and the nursing notes.  Pertinent labs & imaging results that were  available during my care of the patient were reviewed by me and considered in my medical decision making (see chart for details).     Most likely allergies Flonase and zyrtec for symptoms Follow up as needed for continued or worsening symptoms  Final Clinical Impressions(s) / UC Diagnoses   Final diagnoses:  Seasonal allergies     Discharge Instructions     I believe this is allergy related We will try Flonase and zyrtec for symptoms.  Your strep test was negative.      ED Prescriptions    Medication Sig Dispense Auth. Provider   cetirizine (ZYRTEC) 10 MG tablet Take 1 tablet (10 mg total) by mouth daily. 30 tablet Kary Colaizzi A, NP   fluticasone (FLONASE) 50 MCG/ACT nasal spray Place 1 spray into both nostrils daily. 16 g Dahlia Byes A, NP     Controlled Substance Prescriptions [place] Controlled Substance Registry consulted? Not Applicable   Janace Aris, NP 12/15/17 1410

## 2017-12-15 NOTE — ED Triage Notes (Signed)
Pt c/o Sore throat x 4 days

## 2017-12-18 LAB — CULTURE, GROUP A STREP (THRC)

## 2018-03-07 ENCOUNTER — Other Ambulatory Visit (HOSPITAL_COMMUNITY): Payer: Self-pay | Admitting: *Deleted

## 2018-03-07 DIAGNOSIS — Z1231 Encounter for screening mammogram for malignant neoplasm of breast: Secondary | ICD-10-CM

## 2018-05-11 ENCOUNTER — Telehealth (HOSPITAL_COMMUNITY): Payer: Self-pay

## 2018-05-11 NOTE — Telephone Encounter (Signed)
Left message with patient about [facility] appointment that will need to be rescheduled due to CO-[id number]. Left name and number for patient to call back. °

## 2018-05-14 ENCOUNTER — Other Ambulatory Visit (HOSPITAL_COMMUNITY): Payer: Self-pay | Admitting: *Deleted

## 2018-05-14 DIAGNOSIS — Z1231 Encounter for screening mammogram for malignant neoplasm of breast: Secondary | ICD-10-CM

## 2018-05-17 ENCOUNTER — Ambulatory Visit (HOSPITAL_COMMUNITY): Payer: No Typology Code available for payment source

## 2018-08-30 ENCOUNTER — Ambulatory Visit
Admission: RE | Admit: 2018-08-30 | Discharge: 2018-08-30 | Disposition: A | Discharge status: Home / Self Care | Payer: No Typology Code available for payment source | Source: Ambulatory Visit | Attending: Obstetrics and Gynecology | Admitting: Obstetrics and Gynecology

## 2018-08-30 ENCOUNTER — Encounter (HOSPITAL_COMMUNITY): Payer: Self-pay

## 2018-08-30 ENCOUNTER — Encounter (HOSPITAL_COMMUNITY): Payer: Self-pay | Admitting: *Deleted

## 2018-08-30 ENCOUNTER — Ambulatory Visit (HOSPITAL_COMMUNITY)
Admission: RE | Admit: 2018-08-30 | Discharge: 2018-08-30 | Disposition: A | Discharge status: Home / Self Care | Payer: No Typology Code available for payment source | Source: Ambulatory Visit | Attending: Obstetrics and Gynecology | Admitting: Obstetrics and Gynecology

## 2018-08-30 ENCOUNTER — Other Ambulatory Visit: Payer: Self-pay

## 2018-08-30 DIAGNOSIS — Z1239 Encounter for other screening for malignant neoplasm of breast: Secondary | ICD-10-CM

## 2018-08-30 DIAGNOSIS — Z1231 Encounter for screening mammogram for malignant neoplasm of breast: Secondary | ICD-10-CM

## 2018-08-30 NOTE — Addendum Note (Signed)
Encounter addended by: Loletta Parish, RN on: 08/30/2018 8:59 AM  Actions taken: Clinical Note Signed

## 2018-08-30 NOTE — Patient Instructions (Signed)
Explained breast self awareness with [name]. Patient did not need a Pap smear today due to last Pap smear was 01/09/2018 per patient. Let her know [facility] will cover Pap smears every 3 years unless has a history of abnormal Pap smears. Referred patient to the [facility] for a screening mammogram. Appointment scheduled for Thursday, August 30, 2018 at [facility]. Patient aware of appointment and will be there. Let patient know the [facility] will follow up with her within the next couple weeks with results of mammogram by letter or phone. Angela Brady verbalized understanding.  Brannock, Arvil Chaco, RN 8:38 AM

## 2018-08-30 NOTE — Progress Notes (Addendum)
No complaints today.   Pap Smear: Pap smear not completed today. [facility] and normal per patient. Per patient has no history of an abnormal Pap smear. Last Pap smear result is not in Epic. Pap smear result from 04/28/2011 is in [place].  Physical exam: Breasts Breasts symmetrical. No skin abnormalities bilateral breasts. Left nipple slightly inverted that per patient is normal for her. No nipple retraction right breast. No nipple discharge bilateral breasts. No lymphadenopathy. No lumps palpated bilateral breasts. No complaints of pain or tenderness on exam. Referred patient to the [facility] for a screening mammogram. Appointment scheduled for Thursday, August 30, 2018 at [place].        Pelvic/Bimanual No Pap smear completed today since last Pap smear was 01/09/2018 per patient. Pap smear not indicated per [facility] guidelines.   Smoking History: Patient has never smoked.  Patient Navigation: Patient education provided. Access to services provided for patient through [facility] program. Spanish interpreter provided.   Breast and Cervical Cancer Risk Assessment: Patient has no family history of breast cancer, known genetic mutations, or radiation treatment to the chest before age 49. Patient has no history of cervical dysplasia, immunocompromised, or DES exposure in-utero.  Risk Assessment    Risk Scores      08/30/2018   Last edited by: Armond Hang, LPN   5-year risk: 0.4 %   Lifetime risk: 6.9 %         Used Spanish interpreter Rudene Anda from [place].

## 2018-09-10 ENCOUNTER — Other Ambulatory Visit: Payer: Self-pay

## 2018-09-10 ENCOUNTER — Inpatient Hospital Stay: Payer: Self-pay | Attending: Obstetrics and Gynecology | Admitting: *Deleted

## 2018-09-10 VITALS — BP 114/68 | Temp 97.4°F | Ht 61.75 in | Wt 157.0 lb

## 2018-09-10 DIAGNOSIS — Z0000 Encounter for general adult medical examination without abnormal findings: Secondary | ICD-10-CM

## 2018-09-10 NOTE — Progress Notes (Signed)
Wisewoman initial screening   Interpreter- Rudene Anda, [employer]   Clinical Measurement:  Height: 61.75 in Weight: 157 lb  Blood Pressure: 110/64  Blood Pressure #2: 114/68 Fasting Labs Drawn Today, will review with patient when they result.   Medical History: Patient states that she does not have a history of high cholesterol, high blood pressure or diabetes.   Medications: Patient states that she does not take medication to lower cholesterol, blood pressure, or blood sugar. She is not taking an aspirin a day to help prevent a heart attack or stroke.    Blood pressure, self measurement:  Patient states that she does not measure blood pressure at home and has not been told to do so.   Nutrition: Patient states that on average day she consumes 2 cups of fruit and 1 cup of vegetables. Patient states that she does eat fish at least two times a week. On a typical day patient states that about half of grain products are whole grains. Patient states that she drinks less than 36 ounces of beverages with added sugars weekly. Patient states that she is watching sodium or salt intake. In the past 7 days she has had 0 days where she has drank a drink containing alcohol. Patient does not drink any drinks containing alcohol.     Physical activity: Patient states that she gets 630 minutes of moderate physical activity and 20 minutes of vigorous physical activity each week.   Smoking status: Patient states that she has never smoked tobacco.   Quality of life:  Over the past 2 weeks patient has not had any days where she has had little interest or pleasure in doing things and several days where she has felt down, depressed or hopeless.    Risk reduction and counseling:  Spoke with patient about diet. Encouraged patient to try and get 3 servings/cups of vegetables in diet each day. Also spoke with patient about trying to drink 8-10 glasses of water each day. Spoke with patient about portion control. Gave patient  measuring cups and spoons to take home with her to use when preparing meals.    Navigation:  I will notify patient of lab results.  Patient is aware of 2 more health coaching sessions and a follow up.  Time: 20 minutes

## 2018-09-11 LAB — LIPID PANEL W/O CHOL/HDL RATIO
Cholesterol, Total: 173 mg/dL (ref 100–199)
HDL: 54 mg/dL (ref >39)
LDL Calculated: 91 mg/dL (ref 0–99)
Triglycerides: 142 mg/dL (ref 0–149)
VLDL Cholesterol Cal: 28 mg/dL (ref 5–40)

## 2018-09-11 LAB — GLUCOSE, RANDOM: Glucose: 84 mg/dL (ref 65–99)

## 2018-09-11 LAB — HGB A1C W/O EAG: Hgb A1c MFr Bld: 5.2 % (ref 4.8–5.6)

## 2018-09-17 ENCOUNTER — Telehealth: Payer: Self-pay

## 2018-09-20 NOTE — Telephone Encounter (Signed)
Health coaching 2   Interpreter- Rudene Anda, [employer]   Labs- 173 cholesterol , 91 LDL cholesterol , 142  triglycerides , 54 HDL cholesterol , 5.2 hemoglobin A1C , 84 mean plasma glucose Patient understands and is aware of her lab results.   Goals-  Goals are to increase daily vegetable servings from 1 serving to 3 servings. Increase H20 consumption from 6 glasses to 8-10 glasses per day.    Navigation:  Patient is aware of 1 more health coaching sessions and a follow up.   Time- 10 minutes

## 2018-10-23 ENCOUNTER — Telehealth: Payer: Self-pay

## 2018-10-23 NOTE — Telephone Encounter (Signed)
Health Coaching 3  interpreter- Rudene Anda, [facility]   Goals- Patient has been working on adding more vegetables to diet and increasing how much water that she drinks. Patient has also been exercising 3-4 days per week for 2 hours a day. She has been doing cardio, weights and walking.    New goal- Patient has increased her vegetable intake from 1 servings a day to 2 servings a day. I encouraged her now to increase her vegetable intake to 3 servings/ cups per day.   Barrier to reaching goal- none   Strategies to overcome- NA   Navigation:  Patient is aware of  a follow up session. Patient is scheduled for follow-up appointment on  October 7 @ 8:30 am    Time- 10 minutes

## 2018-11-27 ENCOUNTER — Telehealth (HOSPITAL_COMMUNITY): Payer: Self-pay

## 2018-11-27 NOTE — Telephone Encounter (Signed)
Left message for patient via pacific interpreters reminding patient of follow-up appt for [facility]. Left name and number for patient to call back.

## 2018-11-28 ENCOUNTER — Ambulatory Visit: Payer: No Typology Code available for payment source

## 2018-12-19 ENCOUNTER — Other Ambulatory Visit: Payer: Self-pay

## 2018-12-19 ENCOUNTER — Inpatient Hospital Stay: Payer: Self-pay | Attending: Obstetrics and Gynecology | Admitting: *Deleted

## 2018-12-19 VITALS — BP 119/73 | Temp 98.0°F | Ht 61.75 in | Wt 156.0 lb

## 2018-12-19 DIAGNOSIS — Z0000 Encounter for general adult medical examination without abnormal findings: Secondary | ICD-10-CM

## 2018-12-19 NOTE — Progress Notes (Signed)
Wisewoman follow up  Interpreter: Rudene Anda, [employer]   Clinical Measurement:  Height: 61.75 in Weight: 156 lb  Blood Pressure: 113/65  Blood Pressure #2: 119/75    Medical History:  Patient states that she does not have a history of high cholesterol, blood pressure or diabetes.   Medications:  Patient states that she does not take medication to lower cholesterol, blood pressure or blood sugar. Patient does not take an aspirin a day to help prevent a heart attack or stroke.   Blood pressure, self measurement:  Patient states that she does not measure blood pressure from home and has not been told to do so by a health care provider.    Nutrition:  Patient states that on average she eats 2 cups of fruit and 2 cups of vegetables per day. Patient states that she does eat fish at least 2 times per week. In a typical day patient eats about half servings of whole grains. Patient drinks less than 36 ounces of beverages with added sugar weekly. Patient is currently watching sodium or salt intake. In the past 7 days patient has not had any drinks containing alcohol. On average patient does not drink any drinks containing alcohol.       Physical activity:  Patient states that she gets  450 minutes of moderate and 180minutes of vigorous physical activity each week.  Smoking status:  Patient states that she has never smoked tobacco.   Quality of life:  Over the past 2 weeks patient states that she has days where she has little interest or pleasure in doing things and days where she has felt down, depressed or hopeless.   Health Coaching: Patient states that she joined a gym about 2 months ago and has been working out for 2 hours a day 5 days a week. Encouraged patient to keep up the great work with her exercise routine. Encouraged patient to add in an extra serving of vegetables a day to get the daily recommendation.   Navigation: This was the  follow up session for this patient, I will check up on her  progress in the coming months.  Time: 15 minutes

## 2018-12-21 ENCOUNTER — Other Ambulatory Visit: Payer: Self-pay

## 2018-12-21 DIAGNOSIS — Z20822 Contact with and (suspected) exposure to covid-19: Secondary | ICD-10-CM

## 2018-12-23 ENCOUNTER — Telehealth: Payer: Self-pay

## 2018-12-23 LAB — NOVEL CORONAVIRUS, NAA: SARS-CoV-2, NAA: NOT DETECTED

## 2018-12-23 NOTE — Telephone Encounter (Signed)
Patient called in requesting [place] lab results and MyChart assistance - obtained consent to speak to daughter, Anderson Malta - DOB/Address verified - Negative results given. Reset MyChart set up, no further questions.

## 2019-02-26 ENCOUNTER — Ambulatory Visit (INDEPENDENT_AMBULATORY_CARE_PROVIDER_SITE_OTHER): Payer: Self-pay | Admitting: Emergency Medicine

## 2019-02-26 ENCOUNTER — Encounter: Payer: Self-pay | Admitting: Emergency Medicine

## 2019-02-26 ENCOUNTER — Other Ambulatory Visit: Payer: Self-pay

## 2019-02-26 VITALS — BP 111/69 | HR 63 | Temp 98.7°F | Resp 16 | Ht 62.0 in | Wt 156.0 lb

## 2019-02-26 DIAGNOSIS — F4321 Adjustment disorder with depressed mood: Secondary | ICD-10-CM

## 2019-02-26 DIAGNOSIS — M7918 Myalgia, other site: Secondary | ICD-10-CM

## 2019-02-26 DIAGNOSIS — M2550 Pain in unspecified joint: Secondary | ICD-10-CM

## 2019-02-26 DIAGNOSIS — Z23 Encounter for immunization: Secondary | ICD-10-CM

## 2019-02-26 MED ORDER — ESCITALOPRAM OXALATE 10 MG PO TABS: 10.0000 mg | ORAL_TABLET | Freq: Every day | ORAL | 1 refills | Status: DC

## 2019-02-26 NOTE — Patient Instructions (Addendum)
If you have lab work done today you will be contacted with your lab results within the next 2 weeks.  If you have not heard from [place] then please contact us. The fastest way to get your results is to register for My Chart.   IF you received an x-ray today, you will receive an invoice from [facility]. Please contact [facility] at [phone] with questions or concerns regarding your invoice.   IF you received labwork today, you will receive an invoice from [place]. Please contact [facility] at [phone] with questions or concerns regarding your invoice.   Our billing staff will not be able to assist you with questions regarding bills from these companies.  You will be contacted with the lab results as soon as they are available. The fastest way to get your results is to activate your My Chart account. Instructions are located on the last page of this paperwork. If you have not heard from [place] regarding the results in 2 weeks, please contact this office.     Dolor en el hombro Shoulder Pain Muchas cosas pueden provocar dolor en el hombro, por ejemplo:  Una lesin.  Un movimiento del hombro que se repite [place] y [place] vez de la misma manera (uso excesivo).  Dolor en las articulaciones (artritis). El dolor puede deberse a lo siguiente:  Hinchazn e irritacin (inflamacin) de alguna parte del hombro.  Una lesin en la articulacin del hombro.  Una lesin en: ? Los tejidos que conectan el msculo al hueso (tendones). ? Los tejidos que [employer] s (ligamentos). ? Los [employer]iliated Computer Services. Siga estas indicaciones en su casa: Controle los cambios en sus sntomas. Informe a su mdico acerca de los cambios. Estas indicaciones pueden ayudarlo con Conservation officer, historic buildings. Si tiene un cabestrillo:  Use el cabestrillo como se lo haya indicado el mdico. Quteselo solamente como se lo haya indicado el mdico.  Afloje el cabestrillo si los dedos: ? Hormiguean. ? Se  adormecen. ? Se tornan fros y de [employer].  Mantenga el cabestrillo limpio.  Si el cabestrillo no es impermeable: ? No deje que se moje. ? Qutese el cabestrillo para ducharse o baarse. Control del dolor, la rigidez y la hinchazn   Si se lo indican, aplique hielo sobre la zona dolorida: ? Field seismologist hielo en una bolsa plstica. ? Coloque una [employer] piel y Therapist, nutritional. ? Coloque el hielo durante 69minutos, 2 a 3veces por da. Deje de aplicarse hielo si no ayuda a Best boy.  Apriete una pelota blanda o una almohadilla de goma tanto como sea posible. Esto impide que el hombro se hinche. Tambin ayuda a Veterinary surgeon. Indicaciones generales  [employer] de venta libre y los recetados solamente como se lo haya indicado el mdico.  Consulting civil engineer a todas las visitas de seguimiento como se lo haya indicado el mdico. Esto es importante. Comunquese con un mdico si:  El Holiday representative.  Los medicamentos no Forensic psychologist.  Siente un dolor nuevo en el brazo, la mano o los dedos. Solicite ayuda inmediatamente si:  El brazo, la mano o los dedos: ? Hormiguean. ? Estn adormecidos. ? Estn hinchados. ? Estn doloridos. ? Se tornan de color blanco o azul. Resumen  Varias pueden ser las causas del dolor en el hombro. Estas incluyen lesiones, mover el hombro en el mismo sentido una y Elmon Kirschner, y Social research officer, government en las articulaciones.  Controle los cambios en sus sntomas. Informe  a su mdico acerca de los cambios.  Esta afeccin se puede tratar con un cabestrillo, hielo y un medicamento para Chief Technology Officer.  Comunquese con su mdico si el dolor empeora o tiene un dolor nuevo. Solicite ayuda de inmediato si el brazo, la mano o los dedos se le adormecen o si siente hormigueo, se le hinchan o le duelen.  Concurra a todas las visitas de [address] se lo haya indicado el mdico. Esto es importante. Esta informacin no tiene Theme park manager el consejo del  mdico. Asegrese de hacerle al mdico cualquier pregunta que tenga. Document Revised: 10/11/2017 Document Reviewed: 10/11/2017 Elsevier Patient Education  2020 Elsevier Inc.  Dolor musculoesquelticoCuando el dolor es intenso Musculoskeletal Pain "Dolor musculoesqueltico" hace referencia a los dolores y las [employer], las articulaciones, los msculos y los tejidos que los rodean. Este dolor puede ocurrir en cualquier parte del cuerpo. Puede durar un breve perodo (agudo) o prolongarse mucho tiempo (crnico). Es posible que se realicen un examen fsico, anlisis de laboratorio y estudios de diagnstico por imgenes para Veterinary surgeon causa del dolor musculoesqueltico. Siga estas indicaciones en su casa:  Estilo de vida  Trate de controlar o reducir los niveles de estrs. El estrs aumenta la tensin muscular y Engineer, production musculoesqueltico. Es importante reconocer cuando est ansioso o estresado y aprender distintas formas de Paediatric nurse. Estas pueden incluir, entre [place], las siguientes: ? Yoga o meditacin. ? Terapia cognitiva o conductual. ? Acupuntura o terapia de masajes.  Podr seguir con todas las actividades, a menos que Banker generen ms [employer]ck & Co. Cuando el dolor [place], retome las actividades habituales de a poco. Aumente gradualmente la intensidad y la duracin de las actividades o del ejercicio que realice. Control del dolor, la rigidez y Energy manager los medicamentos de venta libre y los recetados solamente como se lo haya indicado el mdico.  Si el dolor es intenso, el reposo en cama puede ser beneficioso. Acustese o sintese en cualquier posicin que sea cmoda, pero salga de la cama y camine al [employer] Corporation.  Si se lo indican, aplique calor en la zona afectada con la frecuencia que le haya indicado el mdico. Use la fuente de calor que el mdico le recomiende, como una compresa de calor hmedo o una almohadilla  trmica. ? Coloque una First[employer] piel y la fuente de Airline pilot. ? Aplique el calor durante un perodo de 20a38minutos. ? Retire la fuente de calor si la piel se pone de color rojo brillante. Esto es especialmente importante si no puede sentir dolor, calor o fro. Puede correr un riesgo mayor de sufrir quemaduras.  Si se lo indican, aplique hielo sobre la zona dolorida. ? Ponga el hielo en una bolsa plstica. ? Coloque una First[employer] Corp piel y la bolsa de hielo. ? Coloque el hielo durante , de 2a3veces por da. Instrucciones generales  El mdico puede recomendarle que consulte a un fisioterapeuta. Esta persona puede ayudarlo a elaborar un programa de ejercicios seguro. Haga ejercicios como se lo haya indicado el fisioterapeuta.  Concurra a todas las visitas de control, incluidas las sesiones de fisioterapia, como se lo hayan indicado los mdicos. Esto es importante. Comunquese con un mdico si:  El Product/process development scientist.  Los medicamentos no Associate Professor.  No puede usar la parte del cuerpo que le duele, como un brazo, una pierna o el cuello.  Tiene dificultad para dormir.  Tiene dificultad para [employer] Corporation  actividades cotidianas. Solicite ayuda de inmediato si:  Tiene una nueva lesin o el dolor empeora o es diferente.  Tiene adormecimiento u hormigueo en la zona dolorida. Resumen  "Dolor musculoesqueltico" hace referencia a los dolores y las [employer], las articulaciones, los msculos y los tejidos [employer] Corporation rodean.  Este dolor puede ocurrir en cualquier parte del cuerpo.  El mdico puede recomendarle que consulte a un fisioterapeuta. Esta persona puede ayudarlo a elaborar un programa de ejercicios seguro. Haga ejercicios como se lo haya indicado el fisioterapeuta.  Disminuya su nivel de estrs. El estrs puede Administrator, arts musculoesqueltico. Cook los mtodos para disminuir el estrs se pueden mencionar la meditacin, el yoga, la terapia  cognitiva o conductual, la acupuntura y la terapia de [place]. Esta informacin no tiene Theme park manager el consejo del mdico. Asegrese de hacerle al mdico cualquier pregunta que tenga. Document Revised: 11/07/2016 Document Reviewed: 11/07/2016 Elsevier Patient Education  2020 Elsevier Inc.  Vivir con depresin Living With Depression Todas las personas experimentan en algn momento de sus vidas decepciones, tristeza y prdidas. Si se siente deprimido o triste durante 2semanas seguidas, es posible que tenga depresin. La depresin puede afectar sus pensamientos y sentimientos, sus relaciones, actividades diarias y salud fsica. Es causada por cambios que modifican el funcionamiento del cerebro. Si es diagnosticado con depresin, Air traffic controller qu tipo de depresin tiene y cul es el mejor tratamiento para su afeccin. Si vive con depresin, existen [employer]s ayudarn a recuperarse y [place] para evitar que vuelva a padecerla. Cmo lidiar con los cambios en el estilo de vida     Sobrellevar el estrs El estrs es la reaccin del cuerpo a los cambios y eventos que suceden en la vida, tanto [place]. Las situaciones estresantes pueden incluir, entre otras:  Radio broadcast assistant.  La muerte de un cnyuge.  Perder un Aleen Campi.  Jubilarse.  Tener un beb. El estrs puede durar unas pocas horas o puede ser continuo. El estrs puede desempear una funcin muy importante en la depresin, por lo que es importante no solo aprender a Electronics engineer estrs sino tambin a Customer service manager de [place]. Hable con el mdico o un psicoterapeuta si quiere obtener ms informacin acerca de reducir [employer] de estrs. Podrn sugerirle algunas tcnicas para reducir el estrs, como, por ejemplo:  Terapia musical. Esta incluye crear o escuchar msica. Elegir National [employer] guste y que lo inspire.  Meditacin consciente. Este tipo de meditacin se puede Ecologist  est sentado o camina. Implica estar consciente de su respiracin normal en vez de tratar de controlar su respiracin.  Centrarse en la oracin. Este es un tipo de meditacin que implica centrarse en la palabra o frase espiritual. Whitney Muse [place], una frase o una imagen sagrada que sea importante para usted y que le de paz.  Respiracin profunda. Para hacerla, expanda su estmago e inhale lentamente por la nariz. Mantenga la respiracin durante 3 a 5 segundos, luego exhale lentamente y [employer] msculos del estmago se relajen.  Relajacin muscular. Esto implica tensionar voluntariamente los msculos y [employer]. Elija una tcnica para reducir el estrs que se adapte a su estilo de vida y personalidad. Las tcnicas para reducir el estrs llevan tiempo y prctica para poder desarrollarlas. Disponga de 5 a 15 minutos por da para ponerlas en prctica. Los psicoterapeutas pueden ofrecer capacitacin en estas tcnicas. Algunos planes de seguro mdico cubren este tipo de capacitacin. Otras  herramientas que puede usar para controlar el estrs, [place], entre otras:  Midwife un registro del estrs. Esto puede ayudarle a identificar los disparadores del estrs y las maneras de Chief Operating Officer su respuesta a Scientist, product/process development.  Entender cules son sus lmites y Designer, jewellery que no a los requerimientos o eventos que lo conducen a Physiological scientist agenda demasiado repleta de [place].  Pensar acerca de cmo seran sus respuestas a ciertas situaciones. Es posible que no pueda controlar todo, Biomedical engineer s puede Market researcher.  Aadir humor a su vida viendo comedias o programas cmicos de TV.  Hacerse tiempo para actividades que lo ayuden a Lexicographer y a no sentirse culpable por pasar su tiempo de esa [employer]. Medicamentos El mdico podr indicarle ciertos medicamentos si cree que podrn ayudarle a mejorar su afeccin. Evite consumir alcohol y otras sustancias que puedan interferir con el funcionamiento adecuado de sus  medicamentos (interaccin). Tambin es importante:  Hablar con su farmacutico o mdico sobre todos los medicamentos que toma, sus posibles efectos secundarios y los medicamentos que son seguros de tomar al [employer].  Sea parte de todas las decisiones acerca de su tratamiento (toma de decisiones compartida). Esto incluye informar Lockheed Martin secundarios de los medicamentos. Es mejor si comparte las decisiones con su mdico como parte de su tratamiento general. Si el mdico le indica un medicamento, es posible que no note los beneficios completos hasta despus de transcurridas 4 a 8semanas. La mayora de las personas que son tratadas por depresin necesitan continuar medicadas durante al menos 6 a despus de Actor. Si toma medicamentos como parte de su tratamiento, no los suspenda sin conversarlo primero con Film/video editor. Es posible que deba disminuir la dosis del Pharmacist, hospital (disminucin progresiva) con el transcurso del tiempo para reducir el riesgo de efectos secundarios peligrosos. Las [employer] International mdico puede sugerirle que haga terapia familiar junto con la individual e indicarle un Counselling psychologist. Si bien puede que no haya problemas familiares que estn causando su depresin, es importante asegurarse de que su familia disponga de la mayor informacin posible acerca de su salud mental. Wilburt Finlay el apoyo de su familia puede contribuir a que el tratamiento sea exitoso. Cmo reconocer los cambios en su afeccin Cada persona tiene una respuesta distinta a los tratamientos para la depresin. La recuperacin de una depresin profunda sucede cuando hayan transcurrido American Financial sin presentar signos de depresin profunda. Esto significa que usted comenzar a:  Tener ms Corporate investment banker.  Sentirse menos desesperanzado que hace atrs.  Tener ms energa.  Comer en exceso con menos frecuencia o tener un mejor apetito.  Tener mejor  concentracin. El mdico trabajar con usted para decidir los prximos pasos de su recuperacin. Tambin es Public librarian cundo su afeccin empeora. Est atento a estos signos:  Estar fatigado o con poca energa.  Comer demasiado o muy poco.  Dormir demasiado o muy poco.  Sentirse agotado, agitado o desesperanzado.  Tener dificultad para concentrarse o tomar decisiones.  Tener dolencias fsicas sin motivo.  Sentirse irritable, enojado o agresivo. Solicite ayuda tan pronto como usted o sus familiares detecten que estos sntomas regresan. Cmo obtener soporte y [place] de otras personas  Cmo hablar con amigos y familiares sobre su afeccin Hablar con amigos y familiares sobre su afeccin puede ser una forma de obtener soporte y Optometrist. Hable con sus amigos de confianza o familiares, explqueles sus sntomas y cunteles que est tratando su depresin con un mdico. Recursos financieros No todos  los planes de seguro cubren la atencin de la salud mental, por ello es importante consultar con su agente de seguros. Si pagar copagos o servicios de asesoramiento es un problema, busque un centro de atencin de salud mental local o en el distrito. Es posible que ofrezcan servicios pblicos de atencin de salud mental gratis o a muy bajo costo cuando usted no puede costear un mdico privado. Si toma medicamentos para la depresin, puede obtener la forma genrica que es mucho menos [place]. Algunos laboratorios de medicamentos prescriptos tambin ofrecen ayuda a los pacientes que no pueden costearse los medicamentos que necesitan. Siga estas instrucciones en su casa:   Duerma lo suficiente y lo mejor posible.  No consuma ms caf, tabaco, alcohol ni otras sustancias potencialmente peligrosas.  Intente ejercitarse, como caminar o levantar pesos pequeos.  Tome los medicamentos de venta libre y los recetados solamente como se lo haya indicado el mdico.  Consuma una dieta saludable  que incluya abundantes frutas, verduras, cereales integrales, productos lcteos descremados y protenas [place]. No consuma muchos alimentos de alto contenido de grasas slidas, azcares agregados o sal.  Concurra a todas las visitas de control como se lo haya indicado el mdico. Esto es importante. Comunquese con un mdico si:  Deja de tomar los medicamentos antidepresivos y tiene alguno de estos sntomas: ? Nuseas. ? Dolor de [place]. ? Se siente mareado. ? Escalofros y dolores corporales. ? Imposibilidad de dormir (insomnio).  Usted o algn amigo o familiar advierten que su depresin est empeorando. Solicite ayuda de inmediato si:  Tiene pensamientos acerca de Glass blower/designer o daar a Producer, television/film/video. Si alguna vez siente que puede lastimarse o Market researcher a los dems, o tiene pensamientos de poner fin a su vida, busque ayuda de inmediato. Puede dirigirse al servicio de urgencias ms cercano o comunicarse con:  El servicio de Multimedia programmer de su localidad (911 en los [place]).  Una lnea de asistencia al suicida y Freight forwarder en crisis, como la [employer] de Prevencin del Suicidio (National Suicide Prevention Lifeline) al [phone]. Esta est disponible las 24 horas del da. Resumen  Si vive con depresin, existen KB Home	[place] ayudarn a recuperarse y [place] para evitar que vuelva a padecerla.  Trabaje con su equipo de atencin mdica para crear un plan de control que incluya apoyo psicolgico, tcnicas de control del estrs y hbitos de vida saludables. Esta informacin no tiene Marine scientist el consejo del mdico. Asegrese de hacerle al mdico cualquier pregunta que tenga. Document Revised: 05/08/2017 Document Reviewed: 05/17/2016 Elsevier Patient Education  [place].

## 2019-02-26 NOTE — Progress Notes (Signed)
Angela Brady 44 y.o.   Chief Complaint  Patient presents with  . Elbow Pain    LEFT x 2 months  . Shoulder Pain    RIGHT and RIGHT leg pain    HISTORY OF PRESENT ILLNESS: This is a 44 y.o. female complaining of pain to right shoulder, left elbow, and right knee for several months.  Patient works out regularly 3-4 times a week at [employer]  No chronic medical problems.  No history of diabetes or arthritis.  No other associated symptoms. Also complaining of situational depression related to relationship with her husband.  Married mother of 3 having marital difficulties.  Requesting medication. Depression screen [facility] 2/9 02/26/2019 01/06/2017 02/19/2016  Decreased Interest 2 0 0  Down, Depressed, Hopeless 2 0 0  PHQ - 2 Score 4 0 0  Altered sleeping 2 - -  Tired, decreased energy 2 - -  Change in appetite 2 - -  Feeling bad or failure about yourself  2 - -  Trouble concentrating 2 - -  Moving slowly or fidgety/restless 2 - -  Suicidal thoughts 2 - -  PHQ-9 Score 18 - -    HPI   Prior to Admission medications   Medication Sig Start Date End Date Taking? Authorizing Provider  cetirizine (ZYRTEC) 10 MG tablet Take 1 tablet (10 mg total) by mouth daily. Patient not taking: Reported on 08/30/2018 12/15/17   Dahlia Byes A, NP  escitalopram (LEXAPRO) 10 MG tablet Take 1 tablet (10 mg total) by mouth daily. 02/26/19 05/27/19  Georgina Quint, MD  fluticasone [facility]) 50 MCG/ACT nasal spray Place 1 spray into both nostrils daily. Patient not taking: Reported on 08/30/2018 12/15/17   Janace Aris, NP    No Known Allergies  Patient Active Problem List   Diagnosis Date Noted  . Screening breast examination 08/30/2018    History reviewed. No pertinent past medical history.  History reviewed. No pertinent surgical history.  Social History   Socioeconomic History  . Marital status: Single    Spouse name: Not on file  . Number of children: 3  . Years of education: 6th  grade  . Highest education level: 6th grade  Occupational History    Employer: NOT EMPLOYED  Tobacco Use  . Smoking status: Never Smoker  . Smokeless tobacco: Never Used  Substance and Sexual Activity  . Alcohol use: No  . Drug use: No  . Sexual activity: Yes    Birth control/protection: None, Pill  Other Topics Concern  . Not on file  Social History Narrative   Originally for [place], [place].   Came to the [place] in 1998.   Lives with her husband and 3 children.   Social Determinants of Health   Financial Resource Strain:   . Difficulty of Paying Living Expenses: Not on file  Food Insecurity:   . Worried About Programme researcher, broadcasting/film/video in the Last Year: Not on file  . Ran Out of Food in the Last Year: Not on file  Transportation Needs: Unmet Transportation Needs  . Lack of Transportation (Medical): Yes  . Lack of Transportation (Non-Medical): No  Physical Activity:   . Days of Exercise per Week: Not on file  . Minutes of Exercise per Session: Not on file  Stress:   . Feeling of Stress : Not on file  Social Connections:   . Frequency of Communication with Friends and Family: Not on file  . Frequency of Social Gatherings with Friends and Family:  Not on file  . Attends Religious Services: Not on file  . Active Member of Clubs or Organizations: Not on file  . Attends Archivist Meetings: Not on file  . Marital Status: Not on file  Intimate Partner Violence:   . Fear of Current or Ex-Partner: Not on file  . Emotionally Abused: Not on file  . Physically Abused: Not on file  . Sexually Abused: Not on file    History reviewed. No pertinent family history.   Review of Systems  Constitutional: Negative.  Negative for chills and fever.  HENT: Negative.  Negative for congestion and sore throat.   Eyes: Negative.   Respiratory: Negative.  Negative for cough and shortness of breath.   Cardiovascular: Negative.  Negative for chest pain and palpitations.   Gastrointestinal: Negative.  Negative for abdominal pain, blood in stool, diarrhea, nausea and vomiting.  Genitourinary: Negative.   Musculoskeletal: Positive for joint pain (Right shoulder, left elbow, right knee).  Skin: Negative.  Negative for rash.  Neurological: Negative.  Negative for dizziness, sensory change, focal weakness and headaches.  Psychiatric/Behavioral: Positive for depression. Negative for substance abuse and suicidal ideas. The patient is nervous/anxious.   All other systems reviewed and are negative.  Today's Vitals   02/26/19 1437  BP: 111/69  Pulse: 63  Resp: 16  Temp: 98.7 F (37.1 C)  TempSrc: Temporal  SpO2: 97%  Weight: 156 lb (70.8 kg)  Height: 5\' 2"  (1.575 m)   Body mass index is 28.53 kg/m.   Physical Exam Vitals reviewed.  Constitutional:      Appearance: Normal appearance.  HENT:     Head: Normocephalic.  Eyes:     Extraocular Movements: Extraocular movements intact.     Pupils: Pupils are equal, round, and reactive to light.  Cardiovascular:     Rate and Rhythm: Normal rate and regular rhythm.     Pulses: Normal pulses.     Heart sounds: Normal heart sounds.  Pulmonary:     Effort: Pulmonary effort is normal.     Breath sounds: Normal breath sounds.  Musculoskeletal:     Cervical back: Normal range of motion.     Comments: Right shoulder: Painful range of motion and some anterior tenderness. Left elbow: Some tenderness to lateral epicondyle.  Full range of motion.  No erythema or swelling. Hands: Within normal limits.  No signs of arthritis. Right knee: Full range of motion, mild posterior tenderness, otherwise normal. Rest of extremities: Within normal limits.  Skin:    General: Skin is warm and dry.     Capillary Refill: Capillary refill takes less than 2 seconds.  Neurological:     General: No focal deficit present.     Mental Status: She is alert and oriented to person, place, and time.  Psychiatric:        Mood and Affect:  Mood normal.        Behavior: Behavior normal.      ASSESSMENT & PLAN: Maely was seen today for elbow pain and shoulder pain.  Diagnoses and all orders for this visit:  Arthralgia of multiple joints  Situational depression -     escitalopram (LEXAPRO) 10 MG tablet; Take 1 tablet (10 mg total) by mouth daily.  Need for prophylactic vaccination and inoculation against influenza -     Flu Vaccine QUAD 36+ mos IM  Musculoskeletal pain    Patient Instructions       If you have lab work done today you will  be contacted with your lab results within the next 2 weeks.  If you have not heard from [place] then please contact us. The fastest way to get your results is to register for My Chart.   IF you received an x-ray today, you will receive an invoice from [facility]. Please contact [facility] at [phone] with questions or concerns regarding your invoice.   IF you received labwork today, you will receive an invoice from [facility]. Please contact [facility] at [phone] with questions or concerns regarding your invoice.   Our billing staff will not be able to assist you with questions regarding bills from these companies.  You will be contacted with the lab results as soon as they are available. The fastest way to get your results is to activate your My Chart account. Instructions are located on the last page of this paperwork. If you have not heard from [place] regarding the results in 2 weeks, please contact this office.     Dolor en el hombro Shoulder Pain Muchas cosas pueden provocar dolor en el hombro, por ejemplo:  Una lesin.  Un movimiento del hombro que se repite [place] y [place] vez de la misma manera (uso excesivo).  Dolor en las articulaciones (artritis). El dolor puede deberse a lo siguiente:  Hinchazn e irritacin (inflamacin) de alguna parte del hombro.  Una lesin en la articulacin del hombro.  Una lesin en: ? Los tejidos que conectan el  msculo al hueso (tendones). ? Los tejidos que Aetna s (ligamentos). ? Los TransMontaigne. Siga estas indicaciones en su casa: Controle los cambios en sus sntomas. Informe a su mdico acerca de los cambios. Estas indicaciones pueden ayudarlo con Chief Technology Officer. Si tiene un cabestrillo:  Use el cabestrillo como se lo haya indicado el mdico. Quteselo solamente como se lo haya indicado el mdico.  Afloje el cabestrillo si los dedos: ? Hormiguean. ? Se adormecen. ? Se tornan fros y de Edison International.  Mantenga el cabestrillo limpio.  Si el cabestrillo no es impermeable: ? No deje que se moje. ? Qutese el cabestrillo para ducharse o baarse. Control del dolor, la rigidez y la hinchazn   Si se lo indican, aplique hielo sobre la zona dolorida: ? Nature conservation officer hielo en una bolsa plstica. ? Coloque una FirstEnergy Corp piel y Copy. ? Coloque el hielo durante , 2 a 3veces por da. Deje de aplicarse hielo si no ayuda a Engineer, materials.  Apriete una pelota blanda o una almohadilla de goma tanto como sea posible. Esto impide que el hombro se hinche. Tambin ayuda a Medical sales representative. Indicaciones generales  Baxter International de venta libre y los recetados solamente como se lo haya indicado el mdico.  Oceanographer a todas las visitas de seguimiento como se lo haya indicado el mdico. Esto es importante. Comunquese con un mdico si:  El Product/process development scientist.  Los medicamentos no Tourist information centre manager.  Siente un dolor nuevo en el brazo, la mano o los dedos. Solicite ayuda inmediatamente si:  El brazo, la mano o los dedos: ? Hormiguean. ? Estn adormecidos. ? Estn hinchados. ? Estn doloridos. ? Se tornan de color blanco o azul. Resumen  Varias pueden ser las causas del dolor en el hombro. Estas incluyen lesiones, mover el hombro en el mismo sentido una y Laverda Page, y Engineer, mining en las articulaciones.  Controle los cambios en sus sntomas. Informe a su mdico acerca de  los cambios.  Esta afeccin se  puede tratar con un cabestrillo, hielo y un medicamento para Chief Technology Officerel dolor.  Comunquese con su mdico si el dolor empeora o tiene un dolor nuevo. Solicite ayuda de inmediato si el brazo, la mano o los dedos se le adormecen o si siente hormigueo, se le hinchan o le duelen.  Concurra a todas las visitas de [address]seguimiento como se lo haya indicado el mdico. Esto es importante. Esta informacin no tiene Theme park managercomo fin reemplazar el consejo del mdico. Asegrese de hacerle al mdico cualquier pregunta que tenga. Document Revised: 10/11/2017 Document Reviewed: 10/11/2017 Elsevier Patient Education  2020 Elsevier Inc.  Dolor musculoesquelticoCuando el dolor es intenso Musculoskeletal Pain "Dolor musculoesqueltico" hace referencia a los dolores y las Kinder Morgan Energymolestias en los huesos, las articulaciones, los msculos y los tejidos que los rodean. Este dolor puede ocurrir en cualquier parte del cuerpo. Puede durar un breve perodo (agudo) o prolongarse mucho tiempo (crnico). Es posible que se realicen un examen fsico, anlisis de laboratorio y estudios de diagnstico por imgenes para Veterinary surgeonencontrar la causa del dolor musculoesqueltico. Siga estas indicaciones en su casa:  Estilo de vida  Trate de controlar o reducir los niveles de estrs. El estrs aumenta la tensin muscular y Engineer, productionpuede empeorar el dolor musculoesqueltico. Es importante reconocer cuando est ansioso o estresado y aprender distintas formas de Paediatric nursecontrolar este estado. Estas pueden incluir, entre Ingramotras, las siguientes: ? Yoga o meditacin. ? Terapia cognitiva o conductual. ? Acupuntura o terapia de masajes.  Podr seguir con todas las actividades, a menos que Bankerestas le generen ms Merck & Codolor. Cuando el dolor Fairmountdisminuya, retome las actividades habituales de a poco. Aumente gradualmente la intensidad y la duracin de las actividades o del ejercicio que realice. Control del dolor, la rigidez y Energy managerla hinchazn  Tome los medicamentos de venta  libre y los recetados solamente como se lo haya indicado el mdico.  Si el dolor es intenso, el reposo en cama puede ser beneficioso. Acustese o sintese en cualquier posicin que sea cmoda, pero salga de la cama y camine al PPL Corporationmenos cada dos horas.  Si se lo indican, aplique calor en la zona afectada con la frecuencia que le haya indicado el mdico. Use la fuente de calor que el mdico le recomiende, como una compresa de calor hmedo o una almohadilla trmica. ? Coloque una FirstEnergy Corptoalla entre la piel y la fuente de Airline pilotcalor. ? Aplique el calor durante un perodo de 20a4530minutos. ? Retire la fuente de calor si la piel se pone de color rojo brillante. Esto es especialmente importante si no puede sentir dolor, calor o fro. Puede correr un riesgo mayor de sufrir quemaduras.  Si se lo indican, aplique hielo sobre la zona dolorida. ? Ponga el hielo en una bolsa plstica. ? Coloque una FirstEnergy Corptoalla entre la piel y la bolsa de hielo. ? Coloque el hielo durante 20minutos, de 2a3veces por da. Instrucciones generales  El mdico puede recomendarle que consulte a un fisioterapeuta. Esta persona puede ayudarlo a elaborar un programa de ejercicios seguro. Haga ejercicios como se lo haya indicado el fisioterapeuta.  Concurra a todas las visitas de control, incluidas las sesiones de fisioterapia, como se lo hayan indicado los mdicos. Esto es importante. Comunquese con un mdico si:  El Product/process development scientistdolor empeora.  Los medicamentos no Associate Professoralivian el dolor.  No puede usar la parte del cuerpo que le duele, como un brazo, una pierna o el cuello.  Tiene dificultad para dormir.  Tiene dificultad para Xcel Energyrealizar las actividades cotidianas. Solicite ayuda de inmediato si:  Tiene una nueva lesin  o el dolor empeora o es diferente.  Tiene adormecimiento u hormigueo en la zona dolorida. Resumen  "Dolor musculoesqueltico" hace referencia a los dolores y las Kinder Morgan Energy, las articulaciones, los msculos y los tejidos  Sealed [employer] Corporation rodean.  Este dolor puede ocurrir en cualquier parte del cuerpo.  El mdico puede recomendarle que consulte a un fisioterapeuta. Esta persona puede ayudarlo a elaborar un programa de ejercicios seguro. Haga ejercicios como se lo haya indicado el fisioterapeuta.  Disminuya su nivel de estrs. El estrs puede Administrator, arts musculoesqueltico. Maribel los mtodos para disminuir el estrs se pueden mencionar la meditacin, el yoga, la terapia cognitiva o conductual, la acupuntura y la terapia de [place]. Esta informacin no tiene Theme park manager el consejo del mdico. Asegrese de hacerle al mdico cualquier pregunta que tenga. Document Revised: 11/07/2016 Document Reviewed: 11/07/2016 Elsevier Patient Education  2020 Elsevier Inc.  Vivir con depresin Living With Depression Todas las personas experimentan en algn momento de sus vidas decepciones, tristeza y prdidas. Si se siente deprimido o triste durante 2semanas seguidas, es posible que tenga depresin. La depresin puede afectar sus pensamientos y sentimientos, sus relaciones, actividades diarias y salud fsica. Es causada por cambios que modifican el funcionamiento del cerebro. Si es diagnosticado con depresin, Air traffic controller qu tipo de depresin tiene y cul es el mejor tratamiento para su afeccin. Si vive con depresin, existen Express Scripts ayudarn a recuperarse y [place] para evitar que vuelva a padecerla. Cmo lidiar con los cambios en el estilo de vida     Sobrellevar el estrs El estrs es la reaccin del cuerpo a los cambios y eventos que suceden en la vida, tanto [place]. Las situaciones estresantes pueden incluir, entre otras:  Radio broadcast assistant.  La muerte de un cnyuge.  Perder un Aleen Campi.  Jubilarse.  Tener un beb. El estrs puede durar unas pocas horas o puede ser continuo. El estrs puede desempear una funcin muy importante en la depresin, por lo que es importante  no solo aprender a Electronics engineer estrs sino tambin a Customer service manager de [place]. Hable con el mdico o un psicoterapeuta si quiere obtener ms informacin acerca de reducir [employer] de estrs. Podrn sugerirle algunas tcnicas para reducir el estrs, como, por ejemplo:  Terapia musical. Esta incluye crear o escuchar msica. Elegir National Oilwell Varco guste y que lo inspire.  Meditacin consciente. Este tipo de meditacin se puede Ecologist est sentado o camina. Implica estar consciente de su respiracin normal en vez de tratar de controlar su respiracin.  Centrarse en la oracin. Este es un tipo de meditacin que implica centrarse en la palabra o frase espiritual. Whitney Muse Bledsoe, una frase o una imagen sagrada que sea importante para usted y que le de paz.  Respiracin profunda. Para hacerla, expanda su estmago e inhale lentamente por la nariz. Mantenga la respiracin durante 3 a 5 segundos, luego exhale lentamente y Performance Food Group msculos del estmago se relajen.  Relajacin muscular. Esto implica tensionar voluntariamente los msculos y Dynegy. Elija una tcnica para reducir el estrs que se adapte a su estilo de vida y personalidad. Las tcnicas para reducir el estrs llevan tiempo y prctica para poder desarrollarlas. Disponga de 5 a 15 minutos por da para ponerlas en prctica. Los psicoterapeutas pueden ofrecer capacitacin en estas tcnicas. Algunos planes de seguro mdico cubren este tipo de capacitacin. Otras herramientas que puede usar para controlar el estrs, [place], [place] otras:  Llevar un registro del estrs. Esto puede ayudarle a identificar los disparadores del estrs y las maneras de Chief Operating Officer su respuesta a Scientist, product/process development.  Entender cules son sus lmites y Designer, jewellery que no a los requerimientos o eventos que lo conducen a Physiological scientist agenda demasiado repleta de [place].  Pensar acerca de cmo seran sus respuestas a ciertas situaciones. Es posible que no pueda  controlar todo, Biomedical engineer s puede Market researcher.  Aadir humor a su vida viendo comedias o programas cmicos de TV.  Hacerse tiempo para actividades que lo ayuden a Lexicographer y a no sentirse culpable por pasar su tiempo de esa [employer] Corporation. Medicamentos El mdico podr indicarle ciertos medicamentos si cree que podrn ayudarle a mejorar su afeccin. Evite consumir alcohol y otras sustancias que puedan interferir con el funcionamiento adecuado de sus medicamentos (interaccin). Tambin es importante:  Hablar con su farmacutico o mdico sobre todos los medicamentos que toma, sus posibles efectos secundarios y los medicamentos que son seguros de tomar al Arrow Electronics.  Sea parte de todas las decisiones acerca de su tratamiento (toma de decisiones compartida). Esto incluye informar Lockheed Martin secundarios de los medicamentos. Es mejor si comparte las decisiones con su mdico como parte de su tratamiento general. Si el mdico le indica un medicamento, es posible que no note los beneficios completos hasta despus de transcurridas 4 a 8semanas. La mayora de las personas que son tratadas por depresin necesitan continuar medicadas durante al menos 6 a despus de Actor. Si toma medicamentos como parte de su tratamiento, no los suspenda sin conversarlo primero con Film/video editor. Es posible que deba disminuir la dosis del Pharmacist, hospital (disminucin progresiva) con el transcurso del tiempo para reducir el riesgo de efectos secundarios peligrosos. Las Science Applications International mdico puede sugerirle que haga terapia familiar junto con la individual e indicarle un Counselling psychologist. Si bien puede que no haya problemas familiares que estn causando su depresin, es importante asegurarse de que su familia disponga de la mayor informacin posible acerca de su salud mental. Wilburt Finlay el apoyo de su familia puede contribuir a que el tratamiento sea exitoso. Cmo reconocer los cambios en su  afeccin Cada persona tiene una respuesta distinta a los tratamientos para la depresin. La recuperacin de una depresin profunda sucede cuando hayan transcurrido American Financial sin presentar signos de depresin profunda. Esto significa que usted comenzar a:  Tener ms Corporate investment banker.  Sentirse menos desesperanzado que hace atrs.  Tener ms energa.  Comer en exceso con menos frecuencia o tener un mejor apetito.  Tener mejor concentracin. El mdico trabajar con usted para decidir los prximos pasos de su recuperacin. Tambin es Public librarian cundo su afeccin empeora. Est atento a estos signos:  Estar fatigado o con poca energa.  Comer demasiado o muy poco.  Dormir demasiado o muy poco.  Sentirse agotado, agitado o desesperanzado.  Tener dificultad para concentrarse o tomar decisiones.  Tener dolencias fsicas sin motivo.  Sentirse irritable, enojado o agresivo. Solicite ayuda tan pronto como usted o sus familiares detecten que estos sntomas regresan. Cmo obtener soporte y [place] de otras personas  Cmo hablar con amigos y familiares sobre su afeccin Hablar con amigos y familiares sobre su afeccin puede ser una forma de obtener soporte y Optometrist. Hable con sus amigos de confianza o familiares, explqueles sus sntomas y cunteles que est tratando su depresin con un mdico. Recursos financieros No todos los planes de seguro cubren la atencin de la salud mental, por  ello es importante consultar con su agente de seguros. Si pagar copagos o servicios de asesoramiento es un problema, busque un centro de atencin de salud mental local o en el distrito. Es posible que ofrezcan servicios pblicos de atencin de salud mental gratis o a muy bajo costo cuando usted no puede costear un mdico privado. Si toma medicamentos para la depresin, puede obtener la forma genrica que es mucho menos Rivertoncostosa. Algunos laboratorios de medicamentos prescriptos  tambin ofrecen ayuda a los pacientes que no pueden costearse los medicamentos que necesitan. Siga estas instrucciones en su casa:   Duerma lo suficiente y lo mejor posible.  No consuma ms caf, tabaco, alcohol ni otras sustancias potencialmente peligrosas.  Intente ejercitarse, como caminar o levantar pesos pequeos.  Tome los medicamentos de venta libre y los recetados solamente como se lo haya indicado el mdico.  Consuma una dieta saludable que incluya abundantes frutas, verduras, cereales integrales, productos lcteos descremados y protenas Waterloomagras. No consuma muchos alimentos de alto contenido de grasas slidas, azcares agregados o sal.  Concurra a todas las visitas de control como se lo haya indicado el mdico. Esto es importante. Comunquese con un mdico si:  Deja de tomar los medicamentos antidepresivos y tiene alguno de estos sntomas: ? Nuseas. ? Dolor de Turkmenistancabeza. ? Se siente mareado. ? Escalofros y dolores corporales. ? Imposibilidad de dormir (insomnio).  Usted o algn amigo o familiar advierten que su depresin est empeorando. Solicite ayuda de inmediato si:  Tiene pensamientos acerca de Runner, broadcasting/film/videolastimarse o daar a Economistotras personas. Si alguna vez siente que puede lastimarse o Physicist, medicallastimar a los dems, o tiene pensamientos de poner fin a su vida, busque ayuda de inmediato. Puede dirigirse al servicio de urgencias ms cercano o comunicarse con:  El servicio de Sports administratoremergencias de su localidad (911 en los [place]).  Una lnea de asistencia al suicida y Visual merchandiseratencin en crisis, como la Murphy OilLnea Nacional de Prevencin del Suicidio (National Suicide Prevention Lifeline) al [phone]. Esta est disponible las 24 horas del da. Resumen  Si vive con depresin, existen Express Scriptsmaneras que le ayudarn a recuperarse y Pitcairn Islandstambin maneras para evitar que vuelva a padecerla.  Trabaje con su equipo de atencin mdica para crear un plan de control que incluya apoyo psicolgico, tcnicas de control del  estrs y hbitos de vida saludables. Esta informacin no tiene Theme park managercomo fin reemplazar el consejo del mdico. Asegrese de hacerle al mdico cualquier pregunta que tenga. Document Revised: 05/08/2017 Document Reviewed: 05/17/2016 Elsevier Patient Education  2020 Elsevier Inc.       Edwina BarthMiguel Melane Windholz, MD [facility]

## 2019-09-03 ENCOUNTER — Other Ambulatory Visit: Payer: Self-pay

## 2019-09-03 ENCOUNTER — Encounter: Payer: Self-pay | Admitting: Emergency Medicine

## 2019-09-03 ENCOUNTER — Ambulatory Visit (INDEPENDENT_AMBULATORY_CARE_PROVIDER_SITE_OTHER): Payer: Self-pay | Admitting: Emergency Medicine

## 2019-09-03 VITALS — BP 117/74 | HR 57 | Temp 98.0°F | Ht 62.0 in | Wt 150.0 lb

## 2019-09-03 DIAGNOSIS — F4321 Adjustment disorder with depressed mood: Secondary | ICD-10-CM

## 2019-09-03 MED ORDER — ESCITALOPRAM OXALATE 20 MG PO TABS: 20.0000 mg | ORAL_TABLET | Freq: Every day | ORAL | 3 refills | Status: AC

## 2019-09-03 NOTE — Patient Instructions (Addendum)
   If you have lab work done today you will be contacted with your lab results within the next 2 weeks.  If you have not heard from us then please contact us. The fastest way to get your results is to register for My Chart.   IF you received an x-ray today, you will receive an invoice from [facility]. Please contact [facility] at [phone] with questions or concerns regarding your invoice.   IF you received labwork today, you will receive an invoice from [facility]. Please contact [facility] at [phone] with questions or concerns regarding your invoice.   Our billing staff will not be able to assist you with questions regarding bills from these companies.  You will be contacted with the lab results as soon as they are available. The fastest way to get your results is to activate your My Chart account. Instructions are located on the last page of this paperwork. If you have not heard from us regarding the results in 2 weeks, please contact this office.     Mantenimiento de la salud en las mujeres Health Maintenance, Female Adoptar un estilo de vida saludable y recibir atencin preventiva son importantes para promover la salud y el bienestar. Consulte al mdico sobre:  El esquema adecuado para hacerse pruebas y exmenes peridicos.  Cosas que puede hacer por su cuenta para prevenir enfermedades y mantenerse sana. Qu debo saber sobre la dieta, el peso y el ejercicio? Consuma una dieta saludable   Consuma una dieta que incluya muchas verduras, frutas, productos lcteos con bajo contenido de grasa y protenas magras.  No consuma muchos alimentos ricos en grasas slidas, azcares agregados o sodio. Mantenga un peso saludable El ndice de masa muscular (IMC) se utiliza para identificar problemas de peso. Proporciona una estimacin de la grasa corporal basndose en el peso y la altura. Su mdico puede ayudarle a determinar su IMC y a lograr o mantener un peso  saludable. Haga ejercicio con regularidad Haga ejercicio con regularidad. Esta es una de las prcticas ms importantes que puede hacer por su salud. La mayora de los adultos deben seguir estas pautas:  Realizar, al menos, 150minutos de actividad fsica por semana. El ejercicio debe aumentar la frecuencia cardaca y hacerlo transpirar (ejercicio de intensidad moderada).  Hacer ejercicios de fortalecimiento por lo menos dos veces por semana. Agregue esto a su plan de ejercicio de intensidad moderada.  Pasar menos tiempo sentados. Incluso la actividad fsica ligera puede ser beneficiosa. Controle sus niveles de colesterol y lpidos en la sangre Comience a realizarse anlisis de lpidos y colesterol en la sangre a los 20aos y luego reptalos cada 5aos. Hgase controlar los niveles de colesterol con mayor frecuencia si:  Sus niveles de lpidos y colesterol son altos.  Es mayor de 40aos.  Presenta un alto riesgo de padecer enfermedades cardacas. Qu debo saber sobre las pruebas de deteccin del cncer? Segn su historia clnica y sus antecedentes familiares, es posible que deba realizarse pruebas de deteccin del cncer en diferentes edades. Esto puede incluir pruebas de deteccin de lo siguiente:  Cncer de mama.  Cncer de cuello uterino.  Cncer colorrectal.  Cncer de piel.  Cncer de pulmn. Qu debo saber sobre la enfermedad cardaca, la diabetes y la hipertensin arterial? Presin arterial y enfermedad cardaca  La hipertensin arterial causa enfermedades cardacas y aumenta el riesgo de accidente cerebrovascular. Es ms probable que esto se manifieste en las personas que tienen lecturas de presin arterial alta, tienen ascendencia africana o   tienen sobrepeso.  Hgase controlar la presin arterial: ? Cada 3 a 5 aos si tiene entre 18 y 39 aos. ? Todos los aos si es mayor de 40aos. Diabetes Realcese exmenes de deteccin de la diabetes con regularidad. Este  anlisis revisa el nivel de azcar en la sangre en ayunas. Hgase las pruebas de deteccin:  Cada tresaos despus de los 40aos de edad si tiene un peso normal y un bajo riesgo de padecer diabetes.  Con ms frecuencia y a partir de una edad inferior si tiene sobrepeso o un alto riesgo de padecer diabetes. Qu debo saber sobre la prevencin de infecciones? Hepatitis B Si tiene un riesgo ms alto de contraer hepatitis B, debe someterse a un examen de deteccin de este virus. Hable con el mdico para averiguar si tiene riesgo de contraer la infeccin por hepatitis B. Hepatitis C Se recomienda el anlisis a:  Todos los que nacieron entre 1945 y 1965.  Todas las personas que tengan un riesgo de haber contrado hepatitis C. Enfermedades de transmisin sexual (ETS)  Hgase las pruebas de deteccin de ITS, incluidas la gonorrea y la clamidia, si: ? Es sexualmente activa y es menor de 24aos. ? Es mayor de 24aos, y el mdico le informa que corre riesgo de tener este tipo de infecciones. ? La actividad sexual ha cambiado desde que le hicieron la ltima prueba de deteccin y tiene un riesgo mayor de tener clamidia o gonorrea. Pregntele al mdico si usted tiene riesgo.  Pregntele al mdico si usted tiene un alto riesgo de contraer VIH. El mdico tambin puede recomendarle un medicamento recetado para ayudar a evitar la infeccin por el VIH. Si elige tomar medicamentos para prevenir el VIH, primero debe hacerse los anlisis de deteccin del VIH. Luego debe hacerse anlisis cada 3meses mientras est tomando los medicamentos. Embarazo  Si est por dejar de menstruar (fase premenopusica) y usted puede quedar embarazada, busque asesoramiento antes de quedar embarazada.  Tome de 400 a 800microgramos (mcg) de cido flico todos los das si queda embarazada.  Pida mtodos de control de la natalidad (anticonceptivos) si desea evitar un embarazo no deseado. Osteoporosis y menopausia La  osteoporosis es una enfermedad en la que los huesos pierden los minerales y la fuerza por el avance de la edad. El resultado pueden ser fracturas en los huesos. Si tiene 65aos o ms, o si est en riesgo de sufrir osteoporosis y fracturas, pregunte a su mdico si debe:  Hacerse pruebas de deteccin de prdida sea.  Tomar un suplemento de calcio o de vitamina D para reducir el riesgo de fracturas.  Recibir terapia de reemplazo hormonal (TRH) para tratar los sntomas de la menopausia. Siga estas instrucciones en su casa: Estilo de vida  No consuma ningn producto que contenga nicotina o tabaco, como cigarrillos, cigarrillos electrnicos y tabaco de mascar. Si necesita ayuda para dejar de fumar, consulte al mdico.  No consuma drogas.  No comparta agujas.  Solicite ayuda a su mdico si necesita apoyo o informacin para abandonar las drogas. Consumo de alcohol  No beba alcohol si: ? Su mdico le indica no hacerlo. ? Est embarazada, puede estar embarazada o est tratando de quedar embarazada.  Si bebe alcohol: ? Limite la cantidad que consume de 0 a 1 medida por da. ? Limite la ingesta si est amamantando.  Est atento a la cantidad de alcohol que hay en las bebidas que toma. En los [place], una medida equivale a una botella de cerveza de 12oz (355ml),   un vaso de vino de 5oz (148ml) o un vaso de una bebida alcohlica de alta graduacin de 1oz (44ml). Instrucciones generales  Realcese los estudios de rutina de la salud, dentales y de la vista.  Mantngase al da con las vacunas.  Infrmele a su mdico si: ? Se siente deprimida con frecuencia. ? Alguna vez ha sido vctima de maltrato o no se siente segura en su casa. Resumen  Adoptar un estilo de vida saludable y recibir atencin preventiva son importantes para promover la salud y el bienestar.  Siga las instrucciones del mdico acerca de una dieta saludable, el ejercicio y la realizacin de pruebas o exmenes  para detectar enfermedades.  Siga las instrucciones del mdico con respecto al control del colesterol y la presin arterial. Esta informacin no tiene como fin reemplazar el consejo del mdico. Asegrese de hacerle al mdico cualquier pregunta que tenga. Document Revised: 02/28/2018 Document Reviewed: 02/28/2018 Elsevier Patient Education  2020 Elsevier Inc.  

## 2019-09-03 NOTE — Progress Notes (Signed)
Angela Brady 44 y.o.   Chief Complaint  Patient presents with  . Depression    medication management , pt states may need an increase in medication    HISTORY OF PRESENT ILLNESS: This is Brady 44 y.o. female here for follow-up of depression. Seen by me last January 2021 and started on Lexapro 10 mg daily.  Working well but thinks she may need Brady dose increase. No other complaints or medical concerns today.  HPI   Prior to Admission medications   Medication Sig Start Date End Date Taking? Authorizing Provider  cetirizine (ZYRTEC) 10 MG tablet Take 1 tablet (10 mg total) by mouth daily. Patient not taking: Reported on 08/30/2018 12/15/17   Angela Byes A, NP  escitalopram (LEXAPRO) 20 MG tablet Take 1 tablet (20 mg total) by mouth daily. 09/03/19 12/02/19  Georgina Quint, MD  fluticasone [facility]) 50 MCG/ACT nasal spray Place 1 spray into both nostrils daily. Patient not taking: Reported on 08/30/2018 12/15/17   Angela Aris, NP    No Known Allergies  Patient Active Problem List   Diagnosis Date Noted  . Screening breast examination 08/30/2018    No past medical history on file.  No past surgical history on file.  Social History   Socioeconomic History  . Marital status: Single    Spouse name: Not on file  . Number of children: 3  . Years of education: 6th grade  . Highest education level: 6th grade  Occupational History    Employer: NOT EMPLOYED  Tobacco Use  . Smoking status: Never Smoker  . Smokeless tobacco: Never Used  Vaping Use  . Vaping Use: Never used  Substance and Sexual Activity  . Alcohol use: No  . Drug use: No  . Sexual activity: Yes    Birth control/protection: None, Pill  Other Topics Concern  . Not on file  Social History Narrative   Originally for [place], [place].   Came to the [place] in 1998.   Lives with her husband and 3 children.   Social Determinants of Health   Financial Resource Strain:   . Difficulty of Paying Living  Expenses:   Food Insecurity:   . Worried About Programme researcher, broadcasting/film/video in the Last Year:   . Barista in the Last Year:   Transportation Needs: Unmet Transportation Needs  . Lack of Transportation (Medical): Yes  . Lack of Transportation (Non-Medical): Not on file  Physical Activity:   . Days of Exercise per Week:   . Minutes of Exercise per Session:   Stress:   . Feeling of Stress :   Social Connections:   . Frequency of Communication with Friends and Family:   . Frequency of Social Gatherings with Friends and Family:   . Attends Religious Services:   . Active Member of Clubs or Organizations:   . Attends Banker Meetings:   Marland Kitchen Marital Status:   Intimate Partner Violence:   . Fear of Current or Ex-Partner:   . Emotionally Abused:   Marland Kitchen Physically Abused:   . Sexually Abused:     No family history on file.   Review of Systems  Constitutional: Negative.  Negative for chills and fever.  HENT: Negative.  Negative for congestion and sore throat.   Respiratory: Negative.  Negative for cough and shortness of breath.   Cardiovascular: Negative.  Negative for chest pain and palpitations.  Gastrointestinal: Negative.  Negative for abdominal pain, diarrhea, nausea and vomiting.  Genitourinary:  Negative.  Negative for dysuria and hematuria.  Skin: Negative.  Negative for rash.  Neurological: Negative.  Negative for dizziness and headaches.  All other systems reviewed and are negative.  Today's Vitals   09/03/19 1037  BP: 117/74  Pulse: (!) 57  Temp: 98 F (36.7 C)  SpO2: 98%  Weight: 150 lb (68 kg)  Height: 5\' 2"  (1.575 m)   Body mass index is 27.44 kg/m.   Physical Exam Vitals reviewed.  Constitutional:      Appearance: Normal appearance.  HENT:     Head: Normocephalic.  Eyes:     Extraocular Movements: Extraocular movements intact.     Pupils: Pupils are equal, round, and reactive to light.  Cardiovascular:     Rate and Rhythm: Normal rate and  regular rhythm.     Pulses: Normal pulses.     Heart sounds: Normal heart sounds.  Pulmonary:     Effort: Pulmonary effort is normal.     Breath sounds: Normal breath sounds.  Musculoskeletal:        General: Normal range of motion.     Cervical back: Normal range of motion and neck supple.  Skin:    General: Skin is warm and dry.     Capillary Refill: Capillary refill takes less than 2 seconds.  Neurological:     General: No focal deficit present.     Mental Status: She is alert and oriented to person, place, and time.  Psychiatric:        Mood and Affect: Mood normal.        Behavior: Behavior normal.      ASSESSMENT & PLAN: Angela Brady was seen today for depression.  Diagnoses and all orders for this visit:  Situational depression -     escitalopram (LEXAPRO) 20 MG tablet; Take 1 tablet (20 mg total) by mouth daily.    Patient Instructions       If you have lab work done today you will be contacted with your lab results within the next 2 weeks.  If you have not heard from us then please contact us. The fastest way to get your results is to register for My Chart.   IF you received an x-ray today, you will [facility] an [facility]. Please contact [facility] at [phone] with questions or concerns regarding your invoice.   IF you received labwork today, you will receive an invoice from [facility]. Please contact [facility] at [phone] with questions or concerns regarding your invoice.   Our billing staff will not be able to assist you with questions regarding bills from these companies.  You will be contacted with the lab results as soon as they are available. The fastest way to get your results is to activate your My Chart account. Instructions are located on the last page of this paperwork. If you have not heard from us regarding the results in 2 weeks, please contact this office.     Mantenimiento de Radiographer, therapeuticla salud en las mujeres Health  Maintenance, Female Adoptar un estilo de vida saludable y recibir atencin preventiva son importantes para promover la salud y Counsellorel bienestar. Consulte al mdico sobre:  El esquema adecuado para hacerse pruebas y exmenes peridicos.  Cosas que puede hacer por su cuenta para prevenir enfermedades y Thrivent Financialmantenerse sana. Qu debo saber sobre la dieta, el peso y el ejercicio? Consuma una dieta saludable   Consuma una dieta que incluya muchas verduras, frutas, productos lcteos con bajo contenido de [place] Clubgrasa y  protenas magras.  No consuma muchos alimentos ricos en grasas slidas, azcares agregados o sodio. Mantenga un peso saludable El ndice de masa muscular [facility]) se [place] para identificar problemas de [place]. Proporciona una estimacin de la grasa corporal basndose en el peso y la altura. Su mdico puede ayudarle Brady Engineer, site IMC y Brady Personnel officer o Pharmacologist un peso saludable. Haga ejercicio con regularidad Haga ejercicio con regularidad. Esta es una de las prcticas ms importantes que puede hacer por su salud. La mayora de los adultos deben seguir estas pautas:  Education officer, environmental, al menos, de actividad fsica por semana. El ejercicio debe aumentar la frecuencia cardaca y Media planner transpirar (ejercicio de intensidad moderada).  Hacer ejercicios de fortalecimiento por lo [employer] por semana. Agregue esto Brady su plan de ejercicio de intensidad moderada.  Pasar menos tiempo sentados. Incluso la actividad fsica ligera puede ser beneficiosa. Controle sus niveles de colesterol y lpidos en la sangre Comience Brady realizarse anlisis de lpidos y Oncologist en la sangre Brady los 20aos y luego reptalos cada 5aos. Hgase controlar los niveles de colesterol con mayor frecuencia si:  Sus niveles de lpidos y colesterol son altos.  Es mayor de 40aos.  Presenta un alto riesgo de padecer enfermedades cardacas. Qu debo saber sobre las pruebas de deteccin del cncer? Segn su historia clnica  y sus antecedentes familiares, es posible que deba realizarse pruebas de deteccin del cncer en diferentes edades. Esto puede incluir pruebas de deteccin de lo siguiente:  Cncer de mama.  Cncer de cuello uterino.  Cncer colorrectal.  Cncer de piel.  Cncer de pulmn. Qu debo saber sobre la enfermedad cardaca, la diabetes y la hipertensin arterial? Presin arterial y enfermedad cardaca  La hipertensin arterial causa enfermedades cardacas y [place] el riesgo de accidente cerebrovascular. Es ms probable que esto se manifieste en las personas que tienen lecturas de presin arterial alta, tienen ascendencia africana o tienen sobrepeso.  Hgase controlar la presin arterial: ? Cada 3 Brady 5 aos si tiene entre 18 y 40 aos. ? Todos los aos si es mayor de [place]. Diabetes Realcese exmenes de deteccin de la diabetes con regularidad. Este anlisis revisa el nivel de azcar en la sangre en [place]. Hgase las pruebas de deteccin:  Cada tresaos despus de los 40aos de edad si tiene un peso normal y un bajo riesgo de padecer diabetes.  Con ms frecuencia y Brady partir de [place] edad inferior si tiene sobrepeso o un alto riesgo de padecer diabetes. Qu debo saber sobre la prevencin de infecciones? Hepatitis B Si tiene un riesgo ms alto de contraer hepatitis B, debe someterse Brady un examen de deteccin de este virus. Hable con el mdico para averiguar si tiene riesgo de contraer la infeccin por hepatitis B. Hepatitis C Se recomienda el anlisis Brady:  [employer] 1945 y 1965.  Todas las personas que tengan un riesgo de haber contrado hepatitis C. Enfermedades de transmisin sexual (ETS)  Hgase las pruebas de Airline pilot de ITS, incluidas la gonorrea y la clamidia, si: ? Es sexualmente activa y es menor de [place]. ? Es mayor de 24aos, y Public affairs consultant informa que corre riesgo de tener este tipo de infecciones. ? La actividad sexual ha cambiado desde que le  hicieron la ltima prueba de deteccin y tiene un riesgo mayor de Warehouse manager clamidia o Copy. Pregntele al mdico si usted tiene riesgo.  Pregntele al mdico si usted tiene un alto riesgo de Primary school teacher VIH. El mdico tambin puede  recomendarle un medicamento recetado para ayudar Brady evitar la infeccin por el VIH. Si elige tomar medicamentos para prevenir el VIH, primero debe [employer] de deteccin del VIH. Luego debe hacerse anlisis cada mientras est tomando los medicamentos. Embarazo  Si est por dejar de Armed forces training and education officer (fase premenopusica) y usted puede quedar [place], busque asesoramiento antes de [place].  Tome de 400 Brady (mcg) de cido [employer] si [place].  Pida mtodos de control de la natalidad (anticonceptivos) si desea evitar un embarazo no deseado. Osteoporosis y [place] La osteoporosis es una enfermedad en la que los huesos pierden los minerales y la fuerza por el avance de la edad. El resultado pueden ser fracturas en los [place]. Si tiene 65aos o ms, o si est en riesgo de sufrir osteoporosis y fracturas, pregunte Brady su mdico si debe:  Hacerse pruebas de deteccin de prdida sea.  Tomar un suplemento de calcio o de vitamina D para reducir el riesgo de fracturas.  Recibir terapia de reemplazo hormonal (TRH) para tratar los sntomas de la menopausia. Siga estas instrucciones en su casa: Estilo de vida  No consuma ningn producto que contenga nicotina o tabaco, como cigarrillos, cigarrillos electrnicos y tabaco de Theatre manager. Si necesita ayuda para dejar de fumar, consulte al mdico.  No consuma drogas.  No comparta agujas.  Solicite ayuda Brady su mdico si necesita apoyo o informacin para abandonar las drogas. Consumo de alcohol  No beba alcohol si: ? Su mdico le indica no hacerlo. ? Est embarazada, puede estar embarazada o est tratando de quedar embarazada.  Si bebe alcohol: ? Limite la cantidad que  consume de 0 Brady 1 medida por da. ? Limite la ingesta si est amamantando.  Est atento Brady la cantidad de alcohol que hay en las bebidas que toma. En los [place], una medida equivale Brady una botella de cerveza de 12oz ( ), un vaso de vino de 5oz ( ) o un vaso de una bebida alcohlica de alta graduacin de 1oz (68ml). Instrucciones generales  Realcese los estudios de rutina de la salud, dentales y de Wellsite geologist.  Mantngase al da con las vacunas.  Infrmele Brady su mdico si: ? Se siente deprimida con frecuencia. ? Alguna vez ha sido vctima de [place] o no se siente segura en su casa. Resumen  Adoptar un estilo de vida saludable y recibir atencin preventiva son importantes para promover la salud y Counsellor.  Siga las instrucciones del mdico acerca de una dieta saludable, el ejercicio y la realizacin de pruebas o exmenes para Hotel manager.  Siga las instrucciones del mdico con respecto al control del colesterol y la presin arterial. Esta informacin no tiene Theme park manager el consejo del mdico. Asegrese de hacerle al mdico cualquier pregunta que tenga. Document Revised: 02/28/2018 Document Reviewed: 02/28/2018 Elsevier Patient Education  2020 Elsevier Inc.      Edwina Barth, MD [facility]

## 2020-03-19 ENCOUNTER — Ambulatory Visit (HOSPITAL_COMMUNITY)
Admission: EM | Admit: 2020-03-19 | Discharge: 2020-03-19 | Disposition: A | Discharge status: Home / Self Care | Payer: HRSA Program | Attending: Family Medicine | Admitting: Family Medicine

## 2020-03-19 ENCOUNTER — Encounter (HOSPITAL_COMMUNITY): Payer: Self-pay

## 2020-03-19 ENCOUNTER — Other Ambulatory Visit: Payer: Self-pay

## 2020-03-19 DIAGNOSIS — Z79899 Other long term (current) drug therapy: Secondary | ICD-10-CM | POA: Insufficient documentation

## 2020-03-19 DIAGNOSIS — Z886 Allergy status to analgesic agent status: Secondary | ICD-10-CM | POA: Diagnosis not present

## 2020-03-19 DIAGNOSIS — J069 Acute upper respiratory infection, unspecified: Secondary | ICD-10-CM

## 2020-03-19 DIAGNOSIS — R519 Headache, unspecified: Secondary | ICD-10-CM | POA: Diagnosis not present

## 2020-03-19 DIAGNOSIS — Z7951 Long term (current) use of inhaled steroids: Secondary | ICD-10-CM | POA: Diagnosis not present

## 2020-03-19 DIAGNOSIS — R5383 Other fatigue: Secondary | ICD-10-CM | POA: Insufficient documentation

## 2020-03-19 DIAGNOSIS — U071 COVID-19: Secondary | ICD-10-CM | POA: Diagnosis not present

## 2020-03-19 DIAGNOSIS — R059 Cough, unspecified: Secondary | ICD-10-CM | POA: Diagnosis not present

## 2020-03-19 DIAGNOSIS — R0602 Shortness of breath: Secondary | ICD-10-CM | POA: Insufficient documentation

## 2020-03-19 MED ORDER — PROMETHAZINE-DM 6.25-15 MG/5ML PO SYRP
5.0000 mL | ORAL_SOLUTION | Freq: Four times a day (QID) | ORAL | 0 refills | Status: AC | PRN
Start: 2020-03-19 — End: ?

## 2020-03-19 MED ORDER — ALBUTEROL SULFATE HFA 108 (90 BASE) MCG/ACT IN AERS: 1.0000 | INHALATION_SPRAY | Freq: Four times a day (QID) | RESPIRATORY_TRACT | 0 refills | Status: AC | PRN

## 2020-03-19 NOTE — ED Provider Notes (Signed)
[facility]    CSN: [id number] Arrival date & time: 03/19/20  1653      History   Chief Complaint Chief Complaint  Patient presents with  . Cough  . Headache    HPI Angela Brady is a 45 y.o. female.   Medical interpreter used today to facilitate visit with patient's consent. She is here today for 3 day history of cough, headache, fatigue, congestion, body aches, SOB. Denies CP, abdominal pain, N/V/D. So far taking robitussin without much relief. No known sick contacts, no chronic pulmonary issues.       History reviewed. No pertinent past medical history.  There are no problems to display for this patient.   History reviewed. No pertinent surgical history.  OB History    Gravida  3   Para  3   Term  3   Preterm      AB      Living  3     SAB      IAB      Ectopic      Multiple      Live Births  3            Home Medications    Prior to Admission medications   Medication Sig Start Date End Date Taking? Authorizing Provider  albuterol (VENTOLIN HFA) 108 (90 Base) MCG/ACT inhaler Inhale 1-2 puffs into the lungs every 6 (six) hours as needed for wheezing or shortness of breath. 03/19/20  Yes Particia Nearing, PA-C  promethazine-dextromethorphan (PROMETHAZINE-DM) 6.25-15 MG/5ML syrup Take 5 mLs by mouth 4 (four) times daily as needed for cough. 03/19/20  Yes Particia Nearing, PA-C  cetirizine (ZYRTEC) 10 MG tablet Take 1 tablet (10 mg total) by mouth daily. Patient not taking: No sig reported 12/15/17   Dahlia Byes A, NP  escitalopram (LEXAPRO) 20 MG tablet Take 1 tablet (20 mg total) by mouth daily. 09/03/19 12/02/19  Georgina Quint, MD  fluticasone [facility]) 50 MCG/ACT nasal spray Place 1 spray into both nostrils daily. Patient not taking: No sig reported 12/15/17   Janace Aris, NP    Family History Family History  Problem Relation Age of Onset  . Healthy Mother   . Healthy Father     Social  History Social History   Tobacco Use  . Smoking status: Never Smoker  . Smokeless tobacco: Never Used  Vaping Use  . Vaping Use: Never used  Substance Use Topics  . Alcohol use: No  . Drug use: No     Allergies   Ibuprofen   Review of Systems Review of Systems PER HPI    Physical Exam Triage Vital Signs ED Triage Vitals  Enc Vitals Group     BP 03/19/20 1741 109/72     Pulse Rate 03/19/20 1741 76     Resp 03/19/20 1741 18     Temp 03/19/20 1741 98.4 F (36.9 C)     Temp Source 03/19/20 1741 Oral     SpO2 03/19/20 1741 98 %     Weight --      Height --      Head Circumference --      Peak Flow --      Pain Score 03/19/20 1737 0     Pain Loc --      Pain Edu? --      Excl. in GC? --    No data found.  Updated Vital Signs BP 109/72 (BP Location: Right Arm)  Pulse 76   Temp 98.4 F (36.9 C) (Oral)   Resp 18   LMP 03/17/2020   SpO2 98%   Visual Acuity Right Eye Distance:   Left Eye Distance:   Bilateral Distance:    Right Eye Near:   Left Eye Near:    Bilateral Near:     Physical Exam Vitals and nursing note reviewed.  Constitutional:      Appearance: Normal appearance. She is not ill-appearing.  HENT:     Head: Atraumatic.     Right Ear: Tympanic membrane normal.     Left Ear: Tympanic membrane normal.     Nose: Rhinorrhea present.     Mouth/Throat:     Mouth: Mucous membranes are moist.     Pharynx: Posterior oropharyngeal erythema present.  Eyes:     Extraocular Movements: Extraocular movements intact.     Conjunctiva/sclera: Conjunctivae normal.  Cardiovascular:     Rate and Rhythm: Normal rate and regular rhythm.     Heart sounds: Normal heart sounds.  Pulmonary:     Effort: Pulmonary effort is normal. No respiratory distress.     Breath sounds: Normal breath sounds. No wheezing or rales.  Abdominal:     General: Bowel sounds are normal. There is no distension.     Palpations: Abdomen is soft.     Tenderness: There is no  abdominal tenderness. There is no guarding.  Musculoskeletal:        General: Normal range of motion.     Cervical back: Normal range of motion and neck supple.  Skin:    General: Skin is warm and dry.  Neurological:     Mental Status: She is alert and oriented to person, place, and time.  Psychiatric:        Mood and Affect: Mood normal.        Thought Content: Thought content normal.        Judgment: Judgment normal.      UC Treatments / Results  Labs (all labs ordered are listed, but only abnormal results are displayed) Labs Reviewed  SARS CORONAVIRUS 2 (TAT 6-24 HRS)    EKG   Radiology No results found.  Procedures Procedures (including critical care time)  Medications Ordered in UC Medications - No data to display  Initial Impression / Assessment and Plan / UC Course  I have reviewed the triage vital signs and the nursing notes.  Pertinent labs & imaging results that were available during my care of the patient were reviewed by me and considered in my medical decision making (see chart for details).     Exam and vitals overall reassuring, COVID pcr pending. Albuterol inhaler, phenergan DM, OTC remedies and supportive care reviewed. Isolation discussed. Return for acutely worsening sxs.   Final Clinical Impressions(s) / UC Diagnoses   Final diagnoses:  Viral URI with cough   Discharge Instructions   None    ED Prescriptions    Medication Sig Dispense Auth. Provider   albuterol (VENTOLIN HFA) 108 (90 Base) MCG/ACT inhaler Inhale 1-2 puffs into the lungs every 6 (six) hours as needed for wheezing or shortness of breath. 18 g Roosvelt Maser [place], [place]   promethazine-dextromethorphan (PROMETHAZINE-DM) 6.25-15 MG/5ML syrup Take 5 mLs by mouth 4 (four) times daily as needed for cough. 100 mL Particia Nearing, [place]     PDMP not reviewed this encounter.   Particia Nearing, [place] 03/19/20 [phone]

## 2020-03-19 NOTE — ED Triage Notes (Signed)
Patient presents to Urgent Care with complaints of cough, headache, nausea x 3 days. Initial symptoms started with a sore throat, fever, vomiting, body aches 15 days ago. Pt had a rapid covid test result was negative. Treating symptoms with Robitussin.    Denies fever, vomiting, or diarrhea.

## 2020-03-20 LAB — SARS CORONAVIRUS 2 (TAT 6-24 HRS): SARS Coronavirus 2: POSITIVE — AB

## 2020-07-03 IMAGING — MG DIGITAL SCREENING BILATERAL MAMMOGRAM WITH TOMO AND CAD
8 series · 8 of 24 positions shown · non-contrast
Comparison: Previous exam(s).

CLINICAL DATA: Screening.

EXAM:
DIGITAL SCREENING BILATERAL MAMMOGRAM WITH TOMO AND CAD

[L MLO synth-2D]
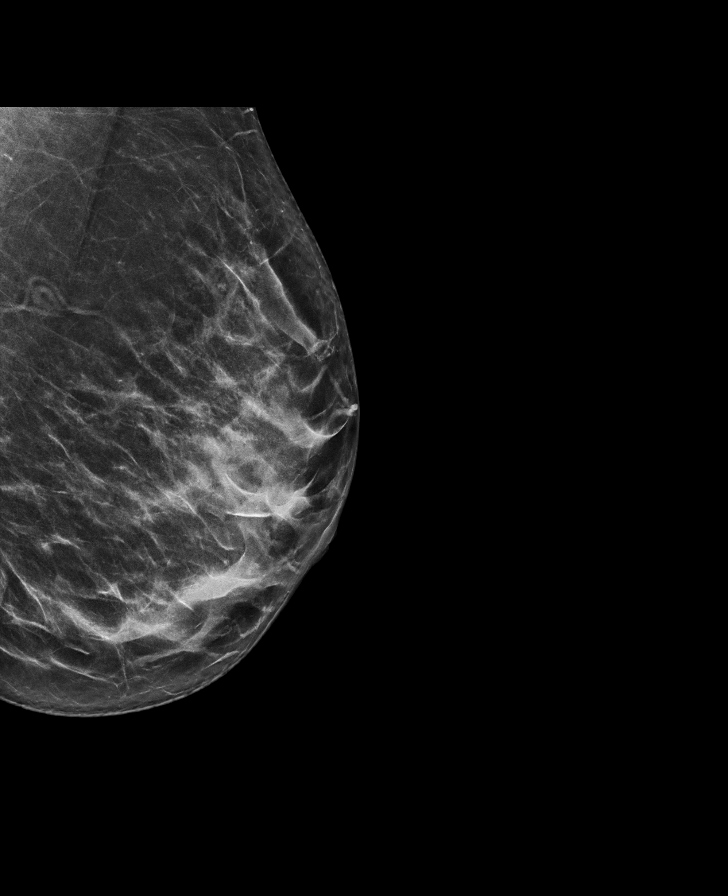

[R CC synth-2D]
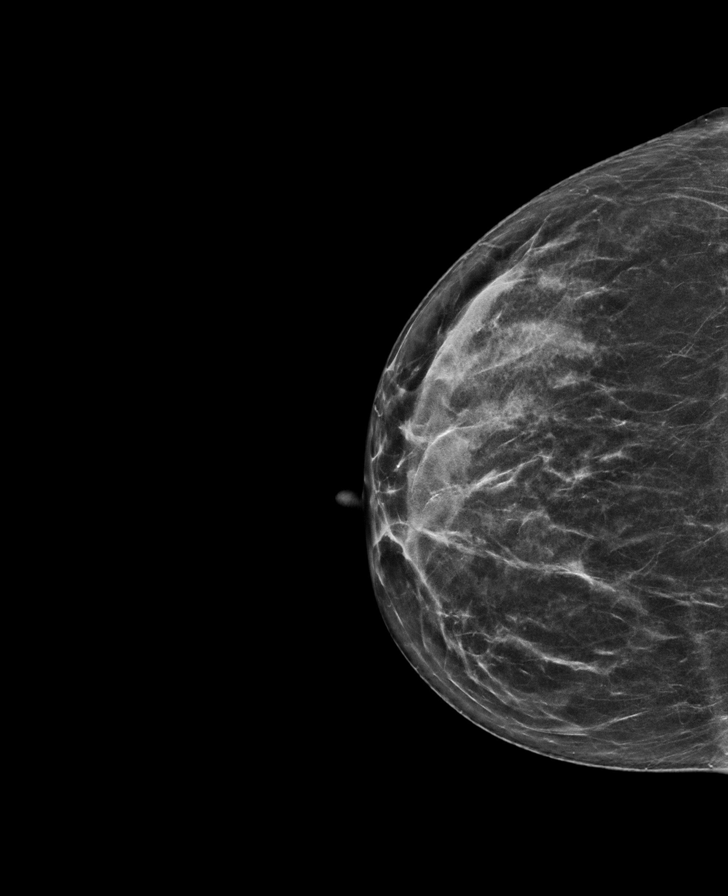

[R MLO synth-2D]
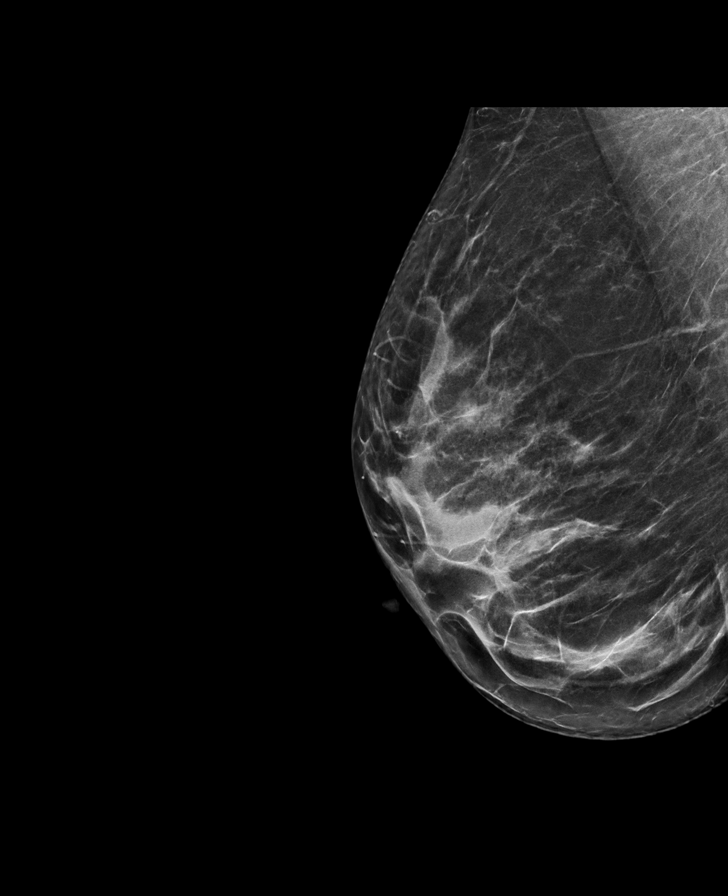

[L CC synth-2D]
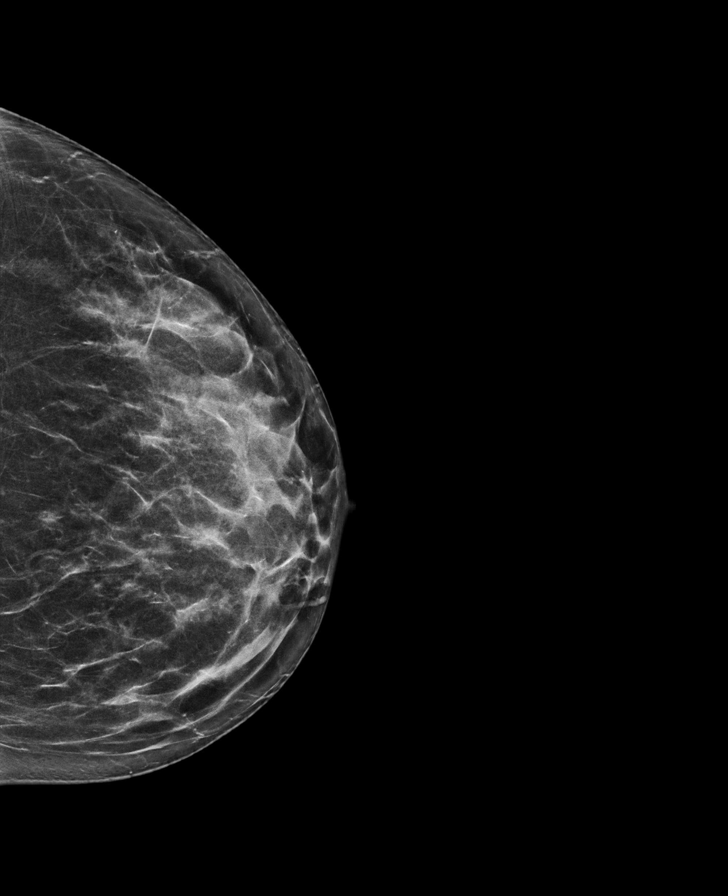

[L MLO tomo · tomo slice 35/68.0]
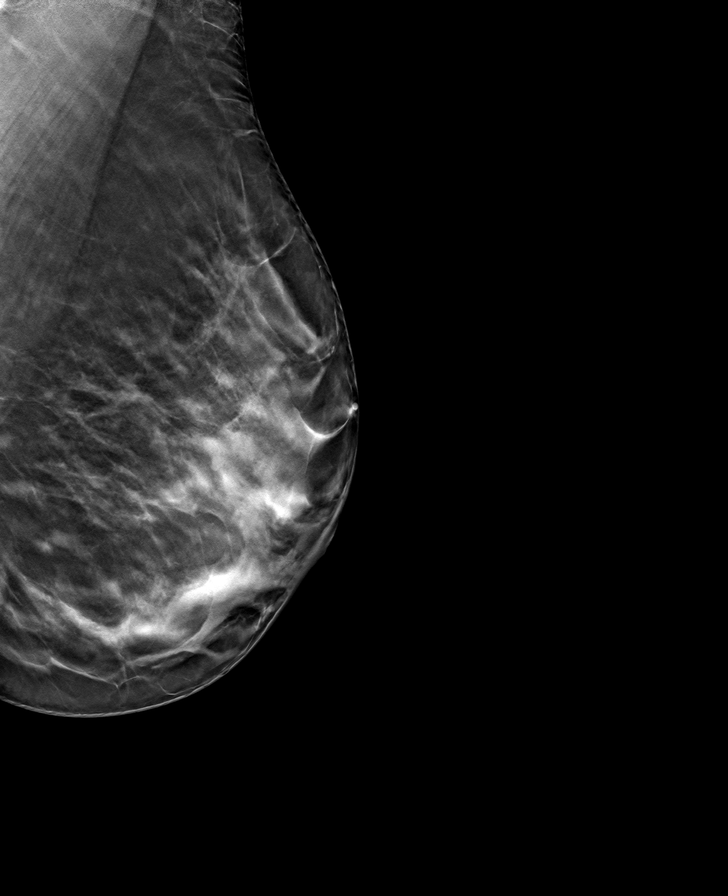

[R MLO tomo · tomo slice 35/68.0]
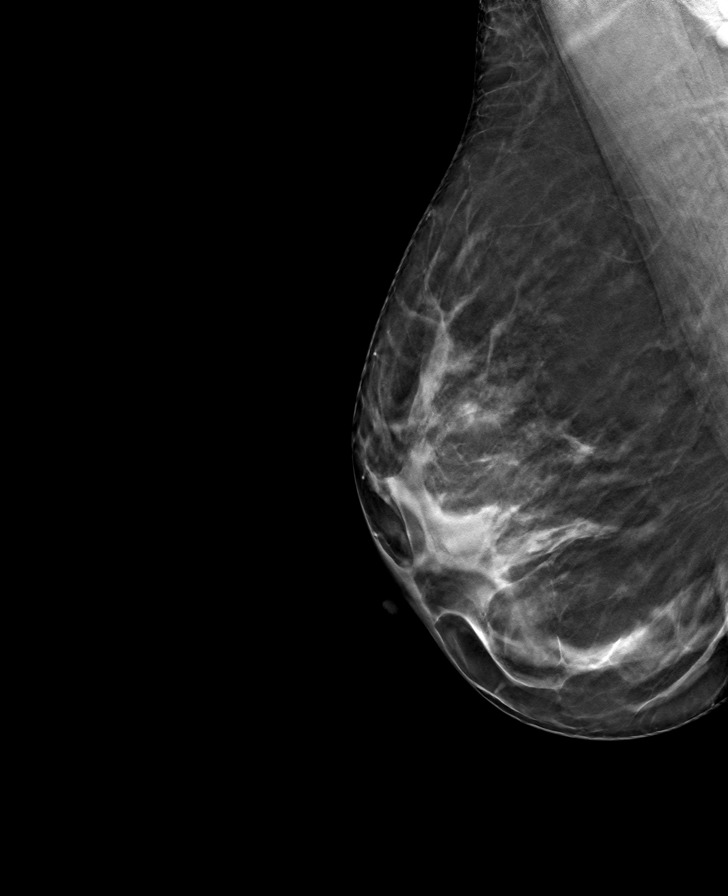

[L CC tomo · tomo slice 33/65.0]
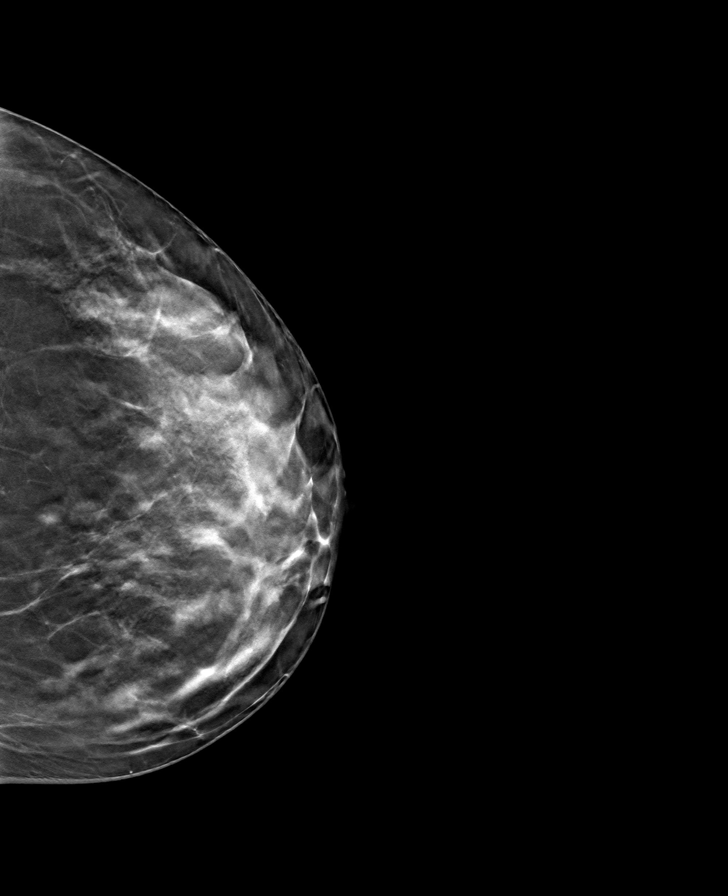

[R CC tomo · tomo slice 33/65.0]
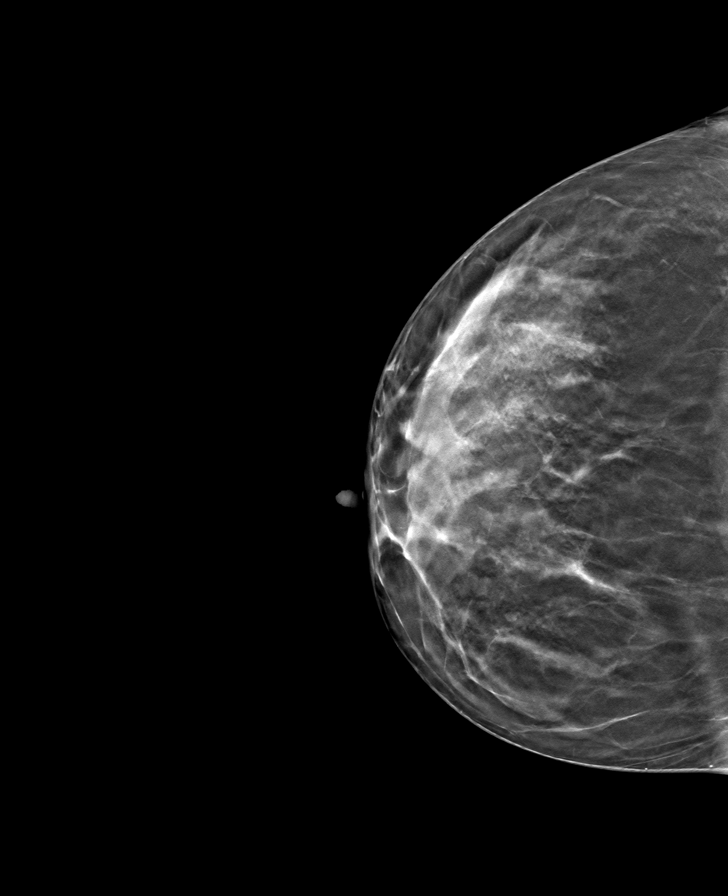

[8 of 24 positions shown; findings below may reference images not displayed]

ACR Breast Density Category b: There are scattered areas of
fibroglandular density.
FINDINGS: There are no findings suspicious for malignancy. Images were
processed with CAD.
IMPRESSION: No mammographic evidence of malignancy. A result letter of this
screening mammogram will be mailed directly to the patient.

RECOMMENDATION:
Screening mammogram in one year. (Code:CN-U-775)

BI-RADS CATEGORY  1: Negative.

## 2023-12-29 ENCOUNTER — Ambulatory Visit: Payer: Self-pay | Admitting: Urgent Care

## 2023-12-29 ENCOUNTER — Ambulatory Visit (INDEPENDENT_AMBULATORY_CARE_PROVIDER_SITE_OTHER): Payer: Self-pay

## 2023-12-29 ENCOUNTER — Ambulatory Visit
Admission: EM | Admit: 2023-12-29 | Discharge: 2023-12-29 | Disposition: A | Discharge status: Home / Self Care | Payer: Self-pay | Attending: Family Medicine | Admitting: Family Medicine

## 2023-12-29 DIAGNOSIS — S8002XA Contusion of left knee, initial encounter: Secondary | ICD-10-CM

## 2023-12-29 DIAGNOSIS — M25562 Pain in left knee: Secondary | ICD-10-CM

## 2023-12-29 DIAGNOSIS — M25462 Effusion, left knee: Secondary | ICD-10-CM

## 2023-12-29 MED ORDER — NAPROXEN 375 MG PO TABS: 375.0000 mg | ORAL_TABLET | Freq: Two times a day (BID) | ORAL | 0 refills | Status: DC

## 2023-12-29 MED ORDER — NAPROXEN 375 MG PO TABS: 375.0000 mg | ORAL_TABLET | Freq: Two times a day (BID) | ORAL | 0 refills | Status: AC

## 2023-12-29 NOTE — ED Triage Notes (Signed)
 Pt reports pain and swelling in the left knee x 5 days after she slipped walking over wet leafs and yesterday pain ws worse after training legs. Tylenol gives some relief.

## 2023-12-29 NOTE — ED Provider Notes (Signed)
 [facility]  Note:  This document was prepared using Conservation officer, historic buildings and may include unintentional dictation errors.  MRN: [id number] DOB: [birth year]  Subjective:   Angela Brady is a 48 y.o. female presenting for 5-day history of persistent left knee pain with swelling.  Symptoms started after she slipped and accidentally fell landing directly onto her knee.  The swelling has improved but the pain lingers.  She is not limited in her range of motion but feels like her knee stiffens up and radiates pain to the back.  No fever, wounds, redness, warmth.  No current facility-administered medications for this encounter.  Current Outpatient Medications:    acetaminophen (TYLENOL) 325 MG tablet, Take 650 mg by mouth every 6 (six) hours as needed., Disp: , Rfl:    albuterol  (VENTOLIN  HFA) 108 (90 Base) MCG/ACT inhaler, Inhale 1-2 puffs into the lungs every 6 (six) hours as needed for wheezing or shortness of breath., Disp: 18 g, Rfl: 0   cetirizine  (ZYRTEC ) 10 MG tablet, Take 1 tablet (10 mg total) by mouth daily. (Patient not taking: Reported on 08/30/2018), Disp: 30 tablet, Rfl: 1   escitalopram  (LEXAPRO ) 20 MG tablet, Take 1 tablet (20 mg total) by mouth daily., Disp: 90 tablet, Rfl: 3   fluticasone  (FLONASE ) 50 MCG/ACT nasal spray, Place 1 spray into both nostrils daily. (Patient not taking: Reported on 08/30/2018), Disp: 16 g, Rfl: 2   promethazine -dextromethorphan (PROMETHAZINE -DM) 6.25-15 MG/5ML syrup, Take 5 mLs by mouth 4 (four) times daily as needed for cough., Disp: 100 mL, Rfl: 0   Allergies  Allergen Reactions   Ibuprofen     Nausea     History reviewed. No pertinent past medical history.   History reviewed. No pertinent surgical history.  Family History  Problem Relation Age of Onset   Healthy Mother    Healthy Father     Social History   Tobacco Use   Smoking status: Never   Smokeless tobacco: Never  Vaping Use    Vaping status: Never Used  Substance Use Topics   Alcohol use: No   Drug use: Never    ROS   Objective:   Vitals: BP 116/75 (BP Location: Right Arm)   Pulse 66   Temp 98.9 F (37.2 C) (Oral)   Resp 16   LMP 12/21/2023 (Exact Date)   SpO2 95%   Physical Exam Constitutional:      General: She is not in acute distress.    Appearance: Normal appearance. She is well-developed. She is not ill-appearing, toxic-appearing or diaphoretic.  HENT:     Head: Normocephalic and atraumatic.     Nose: Nose normal.     Mouth/Throat:     Mouth: Mucous membranes are moist.  Eyes:     General: No scleral icterus.       Right eye: No discharge.        Left eye: No discharge.     Extraocular Movements: Extraocular movements intact.  Cardiovascular:     Rate and Rhythm: Normal rate.  Pulmonary:     Effort: Pulmonary effort is normal.  Musculoskeletal:     Left knee: Swelling present. No deformity, effusion, erythema, ecchymosis, lacerations, bony tenderness or crepitus. Normal range of motion. Tenderness (mild) present over the medial joint line, lateral joint line and patellar tendon. Normal alignment and normal patellar mobility.  Skin:    General: Skin is warm and dry.  Neurological:     General: No focal deficit present.  Mental Status: She is alert and oriented to person, place, and time.  Psychiatric:        Mood and Affect: Mood normal.        Behavior: Behavior normal.    Applied a 4 inch Ace wrap to the left knee.  DG Knee Complete 4 Views Left Result Date: 12/29/2023 EXAM: 4 OR MORE VIEW(S) XRAY OF THE LEFT KNEE 12/29/2023 08:49:21 AM COMPARISON: None available. CLINICAL HISTORY: pain FINDINGS: BONES AND JOINTS: No acute fracture. No focal osseous lesion. No joint dislocation. Small suprapatellar knee joint effusion. Mild patellofemoral osteoarthritis. SOFT TISSUES: The soft tissues are unremarkable. IMPRESSION: 1. Small suprapatellar knee joint effusion. 2. Mild  patellofemoral osteoarthritis. Electronically signed by: Ryan Salvage MD 12/29/2023 09:18 AM EST RP Workstation: [id number]    Assessment and Plan :   PDMP not reviewed this encounter.  1. Knee effusion, left   2. Acute pain of left knee   3. Contusion of left knee, initial encounter    Recommended patient follow-up with an orthopedist for consideration of an ultrasound or MRI, physical therapy as deemed appropriate.  Patient is to use general RICE method, naproxen for pain and inflammation otherwise.  Counseled patient on potential for adverse effects with medications prescribed/recommended today, ER and return-to-clinic precautions discussed, patient verbalized understanding.    Christopher Savannah, [place] 12/29/23 [phone]
